# Patient Record
Sex: Female | Born: 1940
Health system: Southern US, Community
[De-identification: ages and names within clinical notes are randomized; demographics above are authoritative.]

## PROBLEM LIST (undated history)

## (undated) DIAGNOSIS — E785 Hyperlipidemia, unspecified: Secondary | ICD-10-CM

## (undated) DIAGNOSIS — M791 Myalgia, unspecified site: Secondary | ICD-10-CM

## (undated) DIAGNOSIS — F419 Anxiety disorder, unspecified: Secondary | ICD-10-CM

## (undated) DIAGNOSIS — R0600 Dyspnea, unspecified: Secondary | ICD-10-CM

## (undated) DIAGNOSIS — E039 Hypothyroidism, unspecified: Secondary | ICD-10-CM

## (undated) DIAGNOSIS — A0472 Enterocolitis due to Clostridium difficile, not specified as recurrent: Secondary | ICD-10-CM

## (undated) DIAGNOSIS — R0609 Other forms of dyspnea: Secondary | ICD-10-CM

## (undated) DIAGNOSIS — Z85038 Personal history of other malignant neoplasm of large intestine: Secondary | ICD-10-CM

## (undated) DIAGNOSIS — C20 Malignant neoplasm of rectum: Secondary | ICD-10-CM

## (undated) DIAGNOSIS — E538 Deficiency of other specified B group vitamins: Secondary | ICD-10-CM

## (undated) HISTORY — DX: Anxiety disorder, unspecified: F41.9

## (undated) HISTORY — DX: Deficiency of other specified B group vitamins: E53.8

## (undated) HISTORY — DX: Hypothyroidism, unspecified: E03.9

## (undated) HISTORY — DX: Other forms of dyspnea: R06.09

## (undated) HISTORY — DX: Myalgia, unspecified site: M79.10

## (undated) HISTORY — DX: Personal history of other malignant neoplasm of large intestine: Z85.038

## (undated) HISTORY — DX: Malignant neoplasm of rectum: C20

## (undated) HISTORY — DX: Hyperlipidemia, unspecified: E78.5

## (undated) HISTORY — DX: Dyspnea, unspecified: R06.00

## (undated) HISTORY — DX: Enterocolitis due to Clostridium difficile, not specified as recurrent: A04.72

---

## 2009-09-14 HISTORY — PX: COLECTOMY: SHX59

## 2009-09-14 HISTORY — PX: COLON RESECTION: SHX5231

## 2010-02-10 ENCOUNTER — Ambulatory Visit: Payer: Self-pay | Admitting: Cardiovascular Disease

## 2010-02-28 ENCOUNTER — Ambulatory Visit: Payer: Self-pay | Admitting: Cardiovascular Disease

## 2010-10-29 ENCOUNTER — Encounter: Payer: Self-pay | Admitting: Cardiovascular Disease

## 2010-11-29 NOTE — Letter (Signed)
February 10, 2010    Dr. Foye Deer  [address]  [place], [place]  [address]   RE:  Danielle Grimes, Danielle Grimes  MRN:  [id number]  /  DOB:  Apr 01, 1941   Dear Dr. Tomasa Blase,   Thank you for referring Ms. Fetting for further cardiac evaluation.  As  you are aware, this is a 70 year old female with the following problem  list:  1. A history of colon cancer, status post resection and colostomy in      March of 2011.  This was followed by radiation therapy and      chemotherapy.  Her chemotherapy medications include oxaliplatin,      fluorouracil, leucovorin, fosaprepitant.  2. Hyperlipidemia.  3. Hypothyroidism.  4. Anxiety.  5. Myalgia.  6. Osteoporosis.   Ms. Kocak is here today for evaluation of an abnormal ECG and a few  episodes of atypical chest pain.  She had a few episodes recently of a  fluttering sensation in her chest which lasted for a few seconds.  She  also complains of exertional dyspnea which has been worse since she was  diagnosed with colon cancer and has been started on treatment.  She has  no previous cardiac history.  There is no previous history of myocardial  infarction.  She has been having a few episodes of dizziness in the  setting of dehydration.  She went to the emergency room about 2 weeks  ago for that reason and was given IV fluids.  There is no reported  syncope.  She does not do any regular exercise.  She is able, though, to  perform her activities of daily living without much limitations.   PAST MEDICAL HISTORY:  As outlined above.   CURRENT MEDICATIONS:  Include:  1. Levothyroxine 150 mcg once daily.  2. Klor-Con 20 mEq once daily.  3. Alendronate 70 mg once weekly.  4. Calcium twice daily.  5. Fish oil twice daily.  6. Vitamin D twice daily.  7. Lomotil 2.5 mg 1-2 tablets as needed.   ALLERGIES:  NO KNOWN DRUG ALLERGIES.   SOCIAL HISTORY:  Negative for smoking, alcohol or drug use.  She is a  retired Lawyer.   FAMILY HISTORY:  Negative for premature  coronary artery disease,  however, both parents died from heart disease.  Her mother died at the  age of 16 from myocardial infarction after cholecystectomy.  Her father  died at the age of 22.   REVIEW OF SYSTEMS:  Remarkable for dyspnea, a few episodes of chest pain  and fluttering sensation, generalized fatigue, anxiety and dizziness.  A  full review of system was obtained and is otherwise negative.   PHYSICAL EXAMINATION:  GENERAL:  She appears to be in no acute distress.  VITAL SIGNS:  Weight is 145.2 pounds, blood pressure is 105/75, pulse is  92 and oxygen saturation is 99% on room air.  NECK:  Examination reveals no masses, no JVD or carotid bruits.  RESPIRATORY:  Normal respiratory effort with no use of accessory  muscles.  Auscultation reveals normal breath sounds with no crackles or  wheezing.  CARDIOVASCULAR:  Normal PMI.  Auscultation reveals normal S1 and S2 with  no gallops or murmurs.  ABDOMEN:  Benign, nontender, nondistended.  There is a surgical scar.  There is a colostomy bag.  EXTREMITIES:  With no clubbing, cyanosis or edema.  The feet feel  slightly cool to touch.  Pedal pulses are +1 bilaterally.  SKIN:  Exam is unremarkable.  PSYCHIATRIC:  She is alert, oriented x3, with normal mood and affect.   Electrocardiogram showed normal sinus rhythm.  There is left axis  deviation.  Possible old inferior infarct.   IMPRESSION:  Atypical chest pain as well as exertional dyspnea with an  abnormal electrocardiogram:  The patient has no previous cardiac  history.  There is no previous history of myocardial infarction.  Her  electrocardiogram is slightly abnormal and suggestive of a possible old  inferior myocardial infarction, although it could be just due to left  axis deviation.  She had a few episodes of fluttering sensation in her  chest but overall there are no convincing symptoms of angina.  Most of  her symptom seems to be exertional dyspnea which has worsened  since she  was diagnosed with cancer and started on chemotherapy.  She is slightly  anemic, which might contribute.  Nonetheless, I think further cardiac  evaluation is warranted.  I will go ahead and obtain a Lexiscan nuclear  stress test as well as an echocardiogram for further evaluation.  The  patient will return for followup after these tests are done.  Further  recommendations will follow after.   Thank you for allowing me to participate in the care of your patient.    Sincerely,      Lorine Bears, MD  Electronically Signed    MA/MedQ  DD: 02/10/2010  DT: 02/10/2010  Job #: [id number]

## 2010-11-29 NOTE — Assessment & Plan Note (Signed)
[facility]                        [facility] OFFICE NOTE   NAME:Grimes, Danielle                           MRN:          [id number]  DATE:02/28/2010                            DOB:          February 25, 1941    PROBLEMS LIST:  1. History of colon cancer, status post resection and colectomy in      March 2011.  This was followed by radiation and chemotherapy.  2. Hyperlipidemia.  3. Hypothyroidism.  4. Anxiety.  5. Myalgia.  6. Osteoporosis.   The patient is here today for a followup visit after she underwent  cardiac evaluation.  I saw her last month for evaluation of an abnormal  ECG with possible inferior Q-waves as well as atypical chest pain.  Since her last visit, she actually feels better.  She has not had any  episodes of chest pain.  She does have chronic exertional dyspnea, but  it seems to be better on the last visit.  She has not had any more  palpitations.   MEDICATIONS:  1. Levothyroxine 150 mg once daily.  2. Klor-Con 20 mEq once daily.  3. Alendronate 70 mg weekly.  4. Calcium 600 mg twice daily.  5. Fish oil twice daily.  6. Vitamin D1 twice daily.  7. Lomotil as needed.   ALLERGIES:  No known drug allergies.   PHYSICAL EXAMINATION:  GENERAL:  Overall, she appears to be in no acute  distress.  VITAL SIGNS:  Weight is 146.2 pounds, blood pressure is 127/78, pulse is  83, oxygen saturation is 97% on room air.  NECK:  No JVD or carotid bruits.  LUNGS:  Clear to auscultation.  CARDIOVASCULAR:  Regular rate and rhythm with no gallops or murmurs.  ABDOMEN:  Benign, nontender, nondistended.  EXTREMITIES:  With no clubbing, cyanosis, or edema.   IMPRESSION:  1. Atypical chest pain with an abnormal baseline ECG.  She did have a      Lexiscan nuclear stress test which showed no evidence of ischemia      or infarct with normal ejection fraction.  She is not having any      more symptoms of chest pain any more.  Her abnormal ECG is  likely      due to left axis deviation and these are not true Q-waves.  2. Exertional dyspnea:  We did do an echocardiogram which showed      normal LV systolic function with only mild aortic, tricuspid, and      mitral regurgitation.  Obviously, at this level of severity, these      should not be causing significant limitations.  No further cardiac      testing for this is needed.  I suspect she does have a component of      physical deconditioning especially after all the surgeries and      chemotherapy that she went through.  She was also slightly anemic      after her chemotherapy which might contribute.  This will be      checked again after her visit with her oncologist.  Once  she is      through with revision of her colostomy and recovers from the      surgery, I recommended to start a regular exercise program to      improve her exercise tolerance.  The patient will follow up with [place]      as needed.     Lorine Bears, MD  Electronically Signed    MA/MedQ  DD: 02/28/2010  DT: 03/01/2010  Job #: [id number]

## 2011-01-31 ENCOUNTER — Encounter: Payer: Self-pay | Admitting: Cardiovascular Disease

## 2011-04-05 ENCOUNTER — Encounter: Payer: Self-pay | Admitting: Infectious Disease

## 2011-04-05 ENCOUNTER — Ambulatory Visit (INDEPENDENT_AMBULATORY_CARE_PROVIDER_SITE_OTHER): Payer: Medicare PPO | Admitting: Infectious Disease

## 2011-04-05 DIAGNOSIS — E538 Deficiency of other specified B group vitamins: Secondary | ICD-10-CM | POA: Insufficient documentation

## 2011-04-05 DIAGNOSIS — A0472 Enterocolitis due to Clostridium difficile, not specified as recurrent: Secondary | ICD-10-CM

## 2011-04-05 DIAGNOSIS — N289 Disorder of kidney and ureter, unspecified: Secondary | ICD-10-CM

## 2011-04-05 DIAGNOSIS — C20 Malignant neoplasm of rectum: Secondary | ICD-10-CM

## 2011-04-05 LAB — CBC WITH DIFFERENTIAL/PLATELET
Eosinophils Relative: 1 % (ref 0–5)
HCT: 40.9 % (ref 36.0–46.0)
Hemoglobin: 13.3 g/dL (ref 12.0–15.0)
Lymphocytes Relative: 24 % (ref 12–46)
MCHC: 32.5 g/dL (ref 30.0–36.0)
MCV: 92.3 fL (ref 78.0–100.0)
Monocytes Absolute: 0.5 10*3/uL (ref 0.1–1.0)
Monocytes Relative: 8 % (ref 3–12)
Neutro Abs: 4.4 10*3/uL (ref 1.7–7.7)
WBC: 6.6 10*3/uL (ref 4.0–10.5)

## 2011-04-05 MED ORDER — VANCOMYCIN HCL 125 MG PO CAPS: 125.0000 mg | ORAL_CAPSULE | ORAL | Status: AC

## 2011-04-05 MED ORDER — VANCOMYCIN HCL 125 MG PO CAPS: 125.0000 mg | ORAL_CAPSULE | ORAL | Status: DC

## 2011-04-05 MED ORDER — SACCHAROMYCES BOULARDII 250 MG PO CAPS: 250.0000 mg | ORAL_CAPSULE | Freq: Two times a day (BID) | ORAL | Status: AC

## 2011-04-05 NOTE — Progress Notes (Signed)
Subjective:    Patient ID: Danielle Grimes, female    DOB: Jun 27, 1941, 70 y.o.   MRN: [id number]  HPI  This is a 59 -year-old Caucasian female with stage IIIB rectal cancer diagnosed in December of 2010. She had a moderately demented differentiated adenocarcinoma T3, N1, M0 that was treated with neoadjuvant radiation and chemotherapy with infusional 5 fluorouracil. She had surgical resection at [facility] March 11. She also had attempted postoperative chemotherapy with FOLFOX but did not tolerate the full round of therapy duty at diarrhea hypotension and dehydration. She recalls that she was treated with ciprofloxacin in March of 2012 who was thought to be urinary tract infection and then developed a discharge in May of 2012 that was treated with ciprofloxacin and Flagyl. This unfortunately worsened and she was ultimately diagnosed with C. difficile colitis in June of 2012 at which point time she received 2 weeks of Flagyl. She is sensitive several recurrences of C. difficile colitis including in July with metronidazole and vancomycin and then with recurrence in August on 2 different occasions again treated with vancomycin. He is like off the vancomycin and then developed perfuse diarrhea. She was incorrectly informed by the emergency department locally where she lives to take Imodium rather than restart her vancomycin. She presents to clinic today for evaluation of her recurrent Clostridium difficile colitis. Spent well over an hour with this patient including greater than 50% of time counseling the patient and coordinating her care. We will integrate leg lengths to explain the nature C. difficile colitis. I explained to her that patients with differential colitis tend to not perform a fracture active immunoglobulins to C. difficile colitis and this particular immune deficit is not currently treated with the therapies we have. I also informed her that antibiotic such as ciprofloxacin  levofloxacin and moxifloxacin as well along with cephalosporins put one at high risk for recurrent C. difficile colitis. I also informed her that proton pump inhibitors and other drugs to suppress the acid content of the stomach also put the patient at risk for recurrent C. difficile colitis. In reviewing her records it does not seem that she ever received in the more standard therapy for recurrent C. difficile colitis by this with a so-called "pulse and taper therapy with vancomycin over 6 weeks' time. Her lites proceed with such a therapy to have her visit the pharmacy next door where she can receive a liquid formulation of the vancomycin which is far cheaper than the pills that have been formulated by the pharmaceutical company. Review of Systems  Constitutional: Negative for fever, chills, diaphoresis, activity change, appetite change, fatigue and unexpected weight change.  HENT: Negative for congestion, sore throat, rhinorrhea, sneezing, trouble swallowing and sinus pressure.   Eyes: Negative for photophobia and visual disturbance.  Respiratory: Negative for cough, chest tightness, shortness of breath, wheezing and stridor.   Cardiovascular: Negative for chest pain, palpitations and leg swelling.  Gastrointestinal: Positive for diarrhea and rectal pain. Negative for nausea, vomiting, abdominal pain, constipation, blood in stool, abdominal distention and anal bleeding.  Genitourinary: Negative for dysuria, hematuria, flank pain and difficulty urinating.  Musculoskeletal: Negative for myalgias, back pain, joint swelling, arthralgias and gait problem.  Skin: Negative for color change, pallor, rash and wound.  Neurological: Negative for dizziness, tremors, weakness and light-headedness.  Hematological: Negative for adenopathy. Does not bruise/bleed easily.  Psychiatric/Behavioral: Negative for behavioral problems, confusion, sleep disturbance, dysphoric mood, decreased concentration and agitation.        Objective:  Physical Exam  Constitutional: She is oriented to person, place, and time. She appears well-developed and well-nourished. No distress.  HENT:  Head: Normocephalic and atraumatic.  Mouth/Throat: Oropharynx is clear and moist. No oropharyngeal exudate.  Eyes: Conjunctivae and EOM are normal. Pupils are equal, round, and reactive to light. No scleral icterus.  Neck: Normal range of motion. Neck supple. No JVD present.  Cardiovascular: Normal rate, regular rhythm and normal heart sounds.  Exam reveals no gallop and no friction rub.   No murmur heard. Pulmonary/Chest: Effort normal and breath sounds normal. No respiratory distress. She has no wheezes. She has no rales. She exhibits no tenderness.  Abdominal: She exhibits distension. She exhibits no mass. There is tenderness. There is no rebound and no guarding.       Surgical sites clean dry and intact  Musculoskeletal: She exhibits no edema and no tenderness.  Lymphadenopathy:    She has no cervical adenopathy.  Neurological: She is alert and oriented to person, place, and time. She has normal reflexes. She exhibits normal muscle tone. Coordination normal.  Skin: Skin is warm and dry. She is not diaphoretic. No erythema. No pallor.  Psychiatric: She has a normal mood and affect. Her behavior is normal. Judgment and thought content normal.          Assessment & Plan:  Clostridium difficile colitis Recurrent. 2 the patient with "pulse and taper vancomycin therapy start Flora Star twice daily and continue indefinitely. Avoid administer antibiotics and particularly please avoid all fluoroquinolones and be very judicious with any use of cephalosporins as well. Avoid proton pump inhibitors and acid suppressive drugs. If she has further chemotherapy she will also have risk from this for recurrent C. difficile colitis as well. Other options would include giving her for dificid and had finally stool transplantation as a  possibility  Renal insufficiency Given the number of stools this is something we need to watch out for a check her creatinine today.

## 2011-04-05 NOTE — Assessment & Plan Note (Signed)
Given the number of stools this is something we need to watch out for a check her creatinine today.

## 2011-04-05 NOTE — Assessment & Plan Note (Signed)
Recurrent. 2 the patient with "pulse and taper vancomycin therapy start Flora Star twice daily and continue indefinitely. Avoid administer antibiotics and particularly please avoid all fluoroquinolones and be very judicious with any use of cephalosporins as well. Avoid proton pump inhibitors and acid suppressive drugs. If she has further chemotherapy she will also have risk from this for recurrent C. difficile colitis as well. Other options would include giving her for dificid and had finally stool transplantation as a possibility

## 2011-04-05 NOTE — Patient Instructions (Signed)
tHE FOLLOWING IS YOUR VANCOMYCIN REGIMEN:  125 mg orally four times daily for  14 days  125 mg orally twice daily for 7 days  125 mg orally once daily for 7 days  125 mg orally every other day for 7 days  125 mg orally every 3 days for 14 days   START FLORASTOR 250MG  TWICE DAILY  AVOID THE FOLLOWING ANTIBIOTICS CIPROFLOXACIN, LEVOFLOXACIN, MOXIFLOXACIN  CEPHALEXIN, CEFTRIAXONE ARE OTHER HIGH RISK ONES BUT ANY ANTIBIOTICS CAN CAUSE C DIFFICILE COLITIS AVOID PROTONIX, NEXIUM, PROTON PUMP INHIBITORS, AVOID ANTACIDS

## 2011-04-06 LAB — COMPLETE METABOLIC PANEL WITH GFR
Albumin: 4.2 g/dL (ref 3.5–5.2)
BUN: 9 mg/dL (ref 6–23)
Calcium: 9.4 mg/dL (ref 8.4–10.5)
Chloride: 102 mEq/L (ref 96–112)
GFR, Est African American: >60 mL/min (ref >60)
GFR, Est Non African American: >60 mL/min (ref >60)
Glucose, Bld: 87 mg/dL (ref 70–99)
Potassium: 4.3 mEq/L (ref 3.5–5.3)

## 2011-05-08 ENCOUNTER — Telehealth: Payer: Self-pay | Admitting: *Deleted

## 2011-05-08 NOTE — Telephone Encounter (Signed)
Per Dr. Daiva Eves, patient given appointment for tomorrow 05/09/11 at 4:15 pm.

## 2011-05-08 NOTE — Telephone Encounter (Signed)
Patient called c/o pain at ileostomy site and rectum.  Having frequent bowel movements, several a day, but not diarrhea.  She tried to make an appointment with her surgeon, but when she told him she had C-diff, she was told to wait until that resolved before she makes an appointment.  She is requesting an appointment with Dr. Daiva Eves or advice. Wendall Mola CMA

## 2011-05-09 ENCOUNTER — Encounter: Payer: Self-pay | Admitting: Infectious Disease

## 2011-05-09 ENCOUNTER — Ambulatory Visit (INDEPENDENT_AMBULATORY_CARE_PROVIDER_SITE_OTHER): Payer: Medicare PPO | Admitting: Infectious Disease

## 2011-05-09 VITALS — BP 155/83 | HR 77 | Temp 97.5°F | Wt 145.0 lb

## 2011-05-09 DIAGNOSIS — C20 Malignant neoplasm of rectum: Secondary | ICD-10-CM

## 2011-05-09 DIAGNOSIS — A0472 Enterocolitis due to Clostridium difficile, not specified as recurrent: Secondary | ICD-10-CM

## 2011-05-09 DIAGNOSIS — R197 Diarrhea, unspecified: Secondary | ICD-10-CM

## 2011-05-09 DIAGNOSIS — K6289 Other specified diseases of anus and rectum: Secondary | ICD-10-CM

## 2011-05-09 DIAGNOSIS — R109 Unspecified abdominal pain: Secondary | ICD-10-CM

## 2011-05-09 LAB — CBC WITH DIFFERENTIAL/PLATELET
Basophils Relative: 1 % (ref 0–1)
Eosinophils Absolute: 0.2 10*3/uL (ref 0.0–0.7)
Eosinophils Relative: 3 % (ref 0–5)
HCT: 41.4 % (ref 36.0–46.0)
Hemoglobin: 13.4 g/dL (ref 12.0–15.0)
MCH: 29.3 pg (ref 26.0–34.0)
MCHC: 32.4 g/dL (ref 30.0–36.0)
Monocytes Absolute: 0.4 10*3/uL (ref 0.1–1.0)
Monocytes Relative: 9 % (ref 3–12)
RDW: 12.7 % (ref 11.5–15.5)

## 2011-05-09 NOTE — Assessment & Plan Note (Signed)
See above discussion, not clear that this is actually c diff that we are dealign with now

## 2011-05-09 NOTE — Assessment & Plan Note (Signed)
Sp resection, chemotherapy and xRT

## 2011-05-09 NOTE — Assessment & Plan Note (Signed)
I am really beginning to wonder if her persistent multiple loose bowel movements  ARE NOT DUE TO C DIFFICILE COLITIS. If she in fact had c diff she should have improved on vancomycin and then worsened off of it which does not appear to be the case. Her WBC being normal when we saw her last would also argue against c difficile and I wish we had tested her at that time with a c diff pcr and toxin. I will check a CT abd/pelvis to investigate further. I will check c diff toxin assay and c diff pcr though I would be shocked if either test is positive on vancomycin. If the CT scan is not showing evidence of colitis I would then stop her vancomycin taper and infer that her condition is not due to recurrent cdifficile colitis. Ultimately she is likely going to need the help of her GI MD Dr Chales Abrahams with likely need for repeat colonoscopy. I am ok with her taking imodium carefully at present

## 2011-05-09 NOTE — Progress Notes (Signed)
Subjective:    Patient ID: Danielle Grimes, female    DOB: [birth year], 70 y.o.   MRN: [id number]  HPI  This is a70 year old Caucasian female with stage IIIB rectal cancer diagnosed in December of 2010. She had a moderately demented differentiated adenocarcinoma T3, N1, M0 that was treated with neoadjuvant radiation and chemotherapy with infusional 5 fluorouracil. She had surgical resection at [facility] March 11. She also had attempted postoperative chemotherapy with FOLFOX but did not tolerate the full round of therapy due to  diarrhea hypotension and dehydration. She recalls that she was treated with ciprofloxacin in March of 2012 who was thought to be urinary tract infection and then developed a discharge in May of 2012 that was treated with ciprofloxacin and Flagyl. This unfortunately worsened and she was ultimately diagnosed with C. difficile colitis in June of 2012 at which point time she received 2 weeks of Flagyl. She is sensitive several recurrences of C. difficile colitis including in July with metronidazole and vancomycin and then with recurrence in August on 2 different occasions again treated with vancomycin. He was  off the vancomycin and then developed perfuse diarrhea. She was incorrectly informed by the emergency department locally where she lives to take Imodium rather than restart her vancomycin. She presents to clinic in September with me  for evaluation of her recurrent Clostridium difficile colitis.   We checked her cbc, cmp and started her on vancomycin pulse and taper course.  Her white blood cell count was not elevated. Since being treated with vancomycin she has not had any improvement in her multiple bowel movements. She continues to have bowel movements at times hourly. They are not watery but are partially formed. According to the patient and the daughter she has actually had these multiple bowel movements ever since she had a re\re anastomosis. In talking to him  closely it seems as if there is not a clear correlation between her multiple bowel movements and an assay being positive for C. Difficile.  I am certainly beginning to wonder if her multiple bowel movements are not due to C. difficile colitis but rather due to a post operative condition. She currently is complaining of abdominal pain and continued loose bowel movements despite her pulse and taper with vancomycin. On talking to her she states that treatment with vancomycin did not reduce the frequency of her bowel movements whatsoever. In fact unfortunately her multiple bowel movements had worsened when taken off the Imodium and Lomotil.  She presents to clinic today urgently do concerned about cramping abdominal pain as well as continued multiple bowel movements at times hourly. She is on the every other day today component of her vancomycin pulse and taper.  We discussed getting blood work today including CBC and CMP. I'm also a chance send her stool for C. difficile toxin and C diff PCR although the sensitivity of either test will be low with the patient on vancomycin.    Spent well over 45 minutes  with this patient including greater than 50% of time counseling the patient and coordinating her care.   Review of Systems  Constitutional: Negative for fever, chills, diaphoresis, activity change, appetite change, fatigue and unexpected weight change.  HENT: Negative for congestion, sore throat, rhinorrhea, sneezing, trouble swallowing and sinus pressure.   Eyes: Negative for photophobia and visual disturbance.  Respiratory: Negative for cough, chest tightness, shortness of breath, wheezing and stridor.   Cardiovascular: Negative for chest pain, palpitations and leg swelling.  Gastrointestinal: Positive  for abdominal pain, diarrhea, abdominal distention and rectal pain. Negative for nausea, vomiting, constipation, blood in stool and anal bleeding.  Genitourinary: Negative for dysuria, hematuria,  flank pain and difficulty urinating.  Musculoskeletal: Negative for myalgias, back pain, joint swelling, arthralgias and gait problem.  Skin: Negative for color change, pallor, rash and wound.  Neurological: Negative for dizziness, tremors, weakness and light-headedness.  Hematological: Negative for adenopathy. Does not bruise/bleed easily.  Psychiatric/Behavioral: Negative for behavioral problems, confusion, sleep disturbance, dysphoric mood, decreased concentration and agitation.       Objective:   Physical Exam  Constitutional: She is oriented to person, place, and time. She appears well-developed and well-nourished. No distress.  HENT:  Head: Normocephalic and atraumatic.  Mouth/Throat: Oropharynx is clear and moist. No oropharyngeal exudate.  Eyes: Conjunctivae and EOM are normal. Pupils are equal, round, and reactive to light. No scleral icterus.  Neck: Normal range of motion. Neck supple. No JVD present.  Cardiovascular: Normal rate, regular rhythm and normal heart sounds.  Exam reveals no gallop and no friction rub.   No murmur heard. Pulmonary/Chest: Effort normal and breath sounds normal. No respiratory distress. She has no wheezes. She has no rales. She exhibits no tenderness.  Abdominal: She exhibits no distension and no mass. There is tenderness. There is no rebound and no guarding.    Musculoskeletal: She exhibits no edema and no tenderness.  Lymphadenopathy:    She has no cervical adenopathy.  Neurological: She is alert and oriented to person, place, and time. She has normal reflexes. She exhibits normal muscle tone. Coordination normal.  Skin: Skin is warm and dry. She is not diaphoretic. No erythema. No pallor.  Psychiatric: She has a normal mood and affect. Her behavior is normal. Judgment and thought content normal.          Assessment & Plan:  Diarrhea I am really beginning to wonder if her persistent multiple loose bowel movements  ARE NOT DUE TO C DIFFICILE  COLITIS. If she in fact had c diff she should have improved on vancomycin and then worsened off of it which does not appear to be the case. Her WBC being normal when we saw her last would also argue against c difficile and I wish we had tested her at that time with a c diff pcr and toxin. I will check a CT abd/pelvis to investigate further. I will check c diff toxin assay and c diff pcr though I would be shocked if either test is positive on vancomycin. If the CT scan is not showing evidence of colitis I would then stop her vancomycin taper and infer that her condition is not due to recurrent cdifficile colitis. Ultimately she is likely going to need the help of her GI MD Dr Chales Abrahams with likely need for repeat colonoscopy. I am ok with her taking imodium carefully at present  Clostridium difficile colitis See above discussion, not clear that this is actually c diff that we are dealign with now  Abdominal pain Likely due to the process in her colon. Ct abdomen may help [place] here  Rectal cancer Sp resection, chemotherapy and xRT  Rectal pain Sitz baths might be helfpul. Controlling her diarrhea is critical

## 2011-05-09 NOTE — Assessment & Plan Note (Signed)
Sitz baths might be helfpul. Controlling her diarrhea is critical

## 2011-05-09 NOTE — Assessment & Plan Note (Signed)
Likely due to the process in her colon. Ct abdomen may help [place] here

## 2011-05-10 LAB — COMPLETE METABOLIC PANEL WITH GFR
ALT: 18 U/L (ref 0–35)
Albumin: 4.3 g/dL (ref 3.5–5.2)
Alkaline Phosphatase: 51 U/L (ref 39–117)
CO2: 26 mEq/L (ref 19–32)
GFR, Est African American: >90 mL/min (ref >90)
Potassium: 4.2 mEq/L (ref 3.5–5.3)
Sodium: 140 mEq/L (ref 135–145)
Total Bilirubin: 0.4 mg/dL (ref 0.3–1.2)
Total Protein: 7.2 g/dL (ref 6.0–8.3)

## 2011-05-16 ENCOUNTER — Other Ambulatory Visit (HOSPITAL_COMMUNITY): Payer: Medicare PPO

## 2011-05-16 ENCOUNTER — Ambulatory Visit (HOSPITAL_COMMUNITY)
Admission: RE | Admit: 2011-05-16 | Discharge: 2011-05-16 | Disposition: A | Discharge status: Home / Self Care | Payer: Medicare PPO | Source: Ambulatory Visit | Attending: Infectious Disease | Admitting: Infectious Disease

## 2011-05-16 DIAGNOSIS — Z09 Encounter for follow-up examination after completed treatment for conditions other than malignant neoplasm: Secondary | ICD-10-CM | POA: Insufficient documentation

## 2011-05-16 DIAGNOSIS — R32 Unspecified urinary incontinence: Secondary | ICD-10-CM | POA: Insufficient documentation

## 2011-05-16 DIAGNOSIS — A0472 Enterocolitis due to Clostridium difficile, not specified as recurrent: Secondary | ICD-10-CM

## 2011-05-16 DIAGNOSIS — R933 Abnormal findings on diagnostic imaging of other parts of digestive tract: Secondary | ICD-10-CM | POA: Insufficient documentation

## 2011-05-16 MED ORDER — IOHEXOL 300 MG/ML  SOLN
80.0000 mL | Freq: Once | INTRAMUSCULAR | Status: AC | PRN
  Administered 2011-05-16: 80 mL via INTRAVENOUS

## 2011-05-22 ENCOUNTER — Encounter: Payer: Self-pay | Admitting: Infectious Disease

## 2011-05-22 ENCOUNTER — Ambulatory Visit (INDEPENDENT_AMBULATORY_CARE_PROVIDER_SITE_OTHER): Payer: Medicare PPO | Admitting: Infectious Disease

## 2011-05-22 DIAGNOSIS — K629 Disease of anus and rectum, unspecified: Secondary | ICD-10-CM | POA: Insufficient documentation

## 2011-05-22 DIAGNOSIS — K9189 Other postprocedural complications and disorders of digestive system: Secondary | ICD-10-CM

## 2011-05-22 DIAGNOSIS — A0472 Enterocolitis due to Clostridium difficile, not specified as recurrent: Secondary | ICD-10-CM

## 2011-05-22 DIAGNOSIS — K651 Peritoneal abscess: Secondary | ICD-10-CM

## 2011-05-22 DIAGNOSIS — IMO0002 Reserved for concepts with insufficient information to code with codable children: Secondary | ICD-10-CM | POA: Insufficient documentation

## 2011-05-22 DIAGNOSIS — R197 Diarrhea, unspecified: Secondary | ICD-10-CM

## 2011-05-22 DIAGNOSIS — K6289 Other specified diseases of anus and rectum: Secondary | ICD-10-CM

## 2011-05-22 DIAGNOSIS — K929 Disease of digestive system, unspecified: Secondary | ICD-10-CM

## 2011-05-22 NOTE — Assessment & Plan Note (Signed)
I suspect most of her postoperative diarrhea that has persisted despite various courses for C diff has actually been postoperative diarrhea rather than colitis. C difficile PCR while extremely sensitive can reflect colonization since it assays for DNA from the organism and not production of the toxin

## 2011-05-22 NOTE — Assessment & Plan Note (Signed)
Reviewed films with Dr. Gery Pray and Fredia Sorrow from Radiology. Supsicion is high for an anastomotic leak. This is not clearly an abscess by imaging. I am going to have the pt see her surgeon at [facility] ;and we will burn CD rom images of findings for surgeon at [facility]. There is possiblity that the pt could have abscess but I am in no way eager to blindly start antibiotics in this pt who has had several bouts of c diff colitis (though this diagnosis may have been harder to prove in some cases given fact of her persisetnt diarrhea postop

## 2011-05-22 NOTE — Assessment & Plan Note (Signed)
Again requires eval at [facility] for narrowing in the rectum

## 2011-05-22 NOTE — Assessment & Plan Note (Signed)
This area of gas and some fluid could be an abscess. IR can place drain here at [facility] but preference would be for this to be done at [facility] or at least in concert with [facility] who perfomed the surgery

## 2011-05-22 NOTE — Assessment & Plan Note (Signed)
She may have had several episodes but this does not explain her symptoms and her course and her CT now shows zero evidence of colitis

## 2011-05-22 NOTE — Progress Notes (Signed)
Subjective:    Patient ID: Danielle Grimes, female    DOB: 13-Jun-1941, 70 y.o.   MRN: [id number]  HPI  70 year old Caucasian female with stage IIIB rectal cancer diagnosed in December of 2010. She had a moderately demented differentiated adenocarcinoma T3, N1, M0 that was treated with neoadjuvant radiation and chemotherapy with infusional 5 fluorouracil. She had surgical resection at [facility] March 11. She also had attempted postoperative chemotherapy with FOLFOX but did not tolerate the full round of therapy due to diarrhea hypotension and dehydration. She recalls that she was treated with ciprofloxacin in March of 2012 who was thought to be urinary tract infection and then developed a discharge in May of 2012 that was treated with ciprofloxacin and Flagyl. This unfortunately worsened and she was ultimately diagnosed with C. difficile colitis in June of 2012 at which point time she received 2 weeks of Flagyl. She is sensitive several recurrences of C. difficile colitis including in July with metronidazole and vancomycin and then with recurrence in August on 2 different occasions again treated with vancomycin. He was off the vancomycin and then developed perfuse diarrhea. She was incorrectly informed by the emergency department locally where she lives to take Imodium rather than restart her vancomycin. She presents to clinic in September with me for evaluation of her recurrent Clostridium difficile colitis.  We checked her cbc, cmp and started her on vancomycin pulse and taper course.  Her white blood cell count was not elevated. Since being treated with vancomycin she has not had any improvement in her multiple bowel movements. She continues to have bowel movements at times hourly. They are not watery but are partially formed. According to the patient and the daughter she has actually had these multiple bowel movements ever since she had a re\re anastomosis. In talking to him closely it  seems as if there is not a clear correlation between her multiple bowel movements and an assay being positive for C. Difficile.  I am certainly beginning to wonder if her multiple bowel movements are not due to C. difficile colitis but rather due to a post operative condition. She continued to complain of abdominal pain and continued loose bowel movements despite her pulse and taper with vancomycin. On talking to her she states that treatment with vancomycin did not reduce the frequency of her bowel movements whatsoever. In fact unfortunately her multiple bowel movements had worsened when taken off the Imodium and Lomotil. I saw her in clnic on the 23rd of October and checked her wbc which was completely normal. We checked c difficile pcr though we knew it would be negative since pt was already on vancomycin. I ordered a CT of the abdomen and pelvis and this showed ABDSOLUTELY NO EVIDENCE FOR COLITIS AND NO EVIDENCE FOR RECURRENT MALIGANCY. IT DID SHOW APPARENT NARROWING IN THE RECTUM AND IT SHOWED A 3X4CM AREA OF LARGELY GAS, BUT ALSO FLUID ADJACENT TO THE ANASTAMOSIS SITE. The patient returnst to clinic today. She has finished her vancomycin pulse and taper. She never began imodium.  Dr. [employer]  Review of Systems  Constitutional: Negative for fever, chills, diaphoresis, activity change, appetite change, fatigue and unexpected weight change.  HENT: Negative for congestion, sore throat, rhinorrhea, sneezing, trouble swallowing and sinus pressure.   Eyes: Negative for photophobia and visual disturbance.  Respiratory: Negative for cough, chest tightness, shortness of breath, wheezing and stridor.   Cardiovascular: Negative for chest pain, palpitations and leg swelling.  Gastrointestinal: Positive for nausea, abdominal  pain and diarrhea. Negative for vomiting, constipation, blood in stool, abdominal distention and anal bleeding.  Genitourinary: Negative for dysuria, hematuria, flank pain and  difficulty urinating.  Musculoskeletal: Negative for myalgias, back pain, joint swelling, arthralgias and gait problem.  Skin: Negative for color change, pallor, rash and wound.  Neurological: Negative for dizziness, tremors, weakness and light-headedness.  Hematological: Negative for adenopathy. Does not bruise/bleed easily.  Psychiatric/Behavioral: Negative for behavioral problems, confusion, sleep disturbance, dysphoric mood, decreased concentration and agitation.       Objective:   Physical Exam  Constitutional: She is oriented to person, place, and time. She appears well-developed and well-nourished. No distress.  HENT:  Head: Normocephalic and atraumatic.  Mouth/Throat: Oropharynx is clear and moist. No oropharyngeal exudate.  Eyes: Conjunctivae and EOM are normal. Pupils are equal, round, and reactive to light. No scleral icterus.  Neck: Normal range of motion. Neck supple. No JVD present.  Cardiovascular: Normal rate, regular rhythm and normal heart sounds.  Exam reveals no gallop and no friction rub.   No murmur heard. Pulmonary/Chest: Effort normal and breath sounds normal. No respiratory distress. She has no wheezes. She has no rales. She exhibits no tenderness.  Abdominal: She exhibits no distension and no mass. There is tenderness. There is no rebound and no guarding.  Musculoskeletal: She exhibits no edema and no tenderness.  Lymphadenopathy:    She has no cervical adenopathy.  Neurological: She is alert and oriented to person, place, and time. She has normal reflexes. She exhibits normal muscle tone. Coordination normal.  Skin: Skin is warm and dry. She is not diaphoretic. No erythema. No pallor.  Psychiatric: She has a normal mood and affect. Her behavior is normal. Judgment and thought content normal.          Assessment & Plan:

## 2011-05-26 ENCOUNTER — Telehealth: Payer: Self-pay | Admitting: Infectious Disease

## 2011-05-26 NOTE — Telephone Encounter (Signed)
  Tamika,  Can you have my notes  AND THE RADIOLOGY REPORT faxed to the following number fro Clelia Schaumann, MD  Fax number  [phone]   I am working on having the images burned on CDrom and sent to :   Rosalyn Charters, MD  [address] [place] [address] 7th floor [address] [place], [place] [address]

## 2011-06-05 ENCOUNTER — Ambulatory Visit: Payer: Medicare PPO | Admitting: Infectious Disease

## 2011-06-19 ENCOUNTER — Other Ambulatory Visit: Payer: Self-pay | Admitting: Licensed Clinical Social Worker

## 2012-08-20 ENCOUNTER — Encounter: Payer: Self-pay | Admitting: Infectious Diseases

## 2014-10-19 HISTORY — PX: COLONOSCOPY: SHX174

## 2014-12-25 DIAGNOSIS — Z1231 Encounter for screening mammogram for malignant neoplasm of breast: Secondary | ICD-10-CM | POA: Diagnosis not present

## 2015-01-01 DIAGNOSIS — R252 Cramp and spasm: Secondary | ICD-10-CM | POA: Diagnosis not present

## 2015-01-01 DIAGNOSIS — E785 Hyperlipidemia, unspecified: Secondary | ICD-10-CM | POA: Diagnosis not present

## 2015-01-01 DIAGNOSIS — Z85038 Personal history of other malignant neoplasm of large intestine: Secondary | ICD-10-CM | POA: Diagnosis not present

## 2015-01-01 DIAGNOSIS — M810 Age-related osteoporosis without current pathological fracture: Secondary | ICD-10-CM | POA: Diagnosis not present

## 2015-01-01 DIAGNOSIS — R197 Diarrhea, unspecified: Secondary | ICD-10-CM | POA: Diagnosis not present

## 2015-01-01 DIAGNOSIS — Z6820 Body mass index (BMI) 20.0-20.9, adult: Secondary | ICD-10-CM | POA: Diagnosis not present

## 2015-01-01 DIAGNOSIS — E039 Hypothyroidism, unspecified: Secondary | ICD-10-CM | POA: Diagnosis not present

## 2015-01-05 DIAGNOSIS — C20 Malignant neoplasm of rectum: Secondary | ICD-10-CM | POA: Diagnosis not present

## 2015-01-07 DIAGNOSIS — Z6821 Body mass index (BMI) 21.0-21.9, adult: Secondary | ICD-10-CM | POA: Diagnosis not present

## 2015-01-07 DIAGNOSIS — Z9181 History of falling: Secondary | ICD-10-CM | POA: Diagnosis not present

## 2015-01-07 DIAGNOSIS — E876 Hypokalemia: Secondary | ICD-10-CM | POA: Diagnosis not present

## 2015-01-07 DIAGNOSIS — E785 Hyperlipidemia, unspecified: Secondary | ICD-10-CM | POA: Diagnosis not present

## 2015-01-07 DIAGNOSIS — K529 Noninfective gastroenteritis and colitis, unspecified: Secondary | ICD-10-CM | POA: Diagnosis not present

## 2015-01-07 DIAGNOSIS — Z1389 Encounter for screening for other disorder: Secondary | ICD-10-CM | POA: Diagnosis not present

## 2015-01-07 DIAGNOSIS — E039 Hypothyroidism, unspecified: Secondary | ICD-10-CM | POA: Diagnosis not present

## 2015-01-07 DIAGNOSIS — C20 Malignant neoplasm of rectum: Secondary | ICD-10-CM | POA: Diagnosis not present

## 2015-01-12 DIAGNOSIS — C44319 Basal cell carcinoma of skin of other parts of face: Secondary | ICD-10-CM | POA: Diagnosis not present

## 2015-02-03 DIAGNOSIS — E039 Hypothyroidism, unspecified: Secondary | ICD-10-CM | POA: Diagnosis not present

## 2015-03-05 DIAGNOSIS — E039 Hypothyroidism, unspecified: Secondary | ICD-10-CM | POA: Diagnosis not present

## 2015-04-02 DIAGNOSIS — J208 Acute bronchitis due to other specified organisms: Secondary | ICD-10-CM | POA: Diagnosis not present

## 2015-04-02 DIAGNOSIS — E039 Hypothyroidism, unspecified: Secondary | ICD-10-CM | POA: Diagnosis not present

## 2015-04-02 DIAGNOSIS — Z6821 Body mass index (BMI) 21.0-21.9, adult: Secondary | ICD-10-CM | POA: Diagnosis not present

## 2015-07-02 DIAGNOSIS — Z85048 Personal history of other malignant neoplasm of rectum, rectosigmoid junction, and anus: Secondary | ICD-10-CM | POA: Diagnosis not present

## 2015-07-09 DIAGNOSIS — K529 Noninfective gastroenteritis and colitis, unspecified: Secondary | ICD-10-CM | POA: Diagnosis not present

## 2015-07-09 DIAGNOSIS — Z6821 Body mass index (BMI) 21.0-21.9, adult: Secondary | ICD-10-CM | POA: Diagnosis not present

## 2015-07-09 DIAGNOSIS — Z23 Encounter for immunization: Secondary | ICD-10-CM | POA: Diagnosis not present

## 2015-07-09 DIAGNOSIS — E785 Hyperlipidemia, unspecified: Secondary | ICD-10-CM | POA: Diagnosis not present

## 2015-07-09 DIAGNOSIS — C20 Malignant neoplasm of rectum: Secondary | ICD-10-CM | POA: Diagnosis not present

## 2015-07-09 DIAGNOSIS — Z139 Encounter for screening, unspecified: Secondary | ICD-10-CM | POA: Diagnosis not present

## 2015-07-09 DIAGNOSIS — E039 Hypothyroidism, unspecified: Secondary | ICD-10-CM | POA: Diagnosis not present

## 2015-07-09 DIAGNOSIS — E876 Hypokalemia: Secondary | ICD-10-CM | POA: Diagnosis not present

## 2015-08-10 DIAGNOSIS — E039 Hypothyroidism, unspecified: Secondary | ICD-10-CM | POA: Diagnosis not present

## 2015-12-30 DIAGNOSIS — R233 Spontaneous ecchymoses: Secondary | ICD-10-CM | POA: Diagnosis not present

## 2015-12-30 DIAGNOSIS — L821 Other seborrheic keratosis: Secondary | ICD-10-CM | POA: Diagnosis not present

## 2015-12-31 DIAGNOSIS — C20 Malignant neoplasm of rectum: Secondary | ICD-10-CM | POA: Diagnosis not present

## 2015-12-31 DIAGNOSIS — Z452 Encounter for adjustment and management of vascular access device: Secondary | ICD-10-CM | POA: Diagnosis not present

## 2016-01-07 DIAGNOSIS — E039 Hypothyroidism, unspecified: Secondary | ICD-10-CM | POA: Diagnosis not present

## 2016-01-07 DIAGNOSIS — E876 Hypokalemia: Secondary | ICD-10-CM | POA: Diagnosis not present

## 2016-01-07 DIAGNOSIS — C20 Malignant neoplasm of rectum: Secondary | ICD-10-CM | POA: Diagnosis not present

## 2016-01-07 DIAGNOSIS — K529 Noninfective gastroenteritis and colitis, unspecified: Secondary | ICD-10-CM | POA: Diagnosis not present

## 2016-01-07 DIAGNOSIS — Z1389 Encounter for screening for other disorder: Secondary | ICD-10-CM | POA: Diagnosis not present

## 2016-01-07 DIAGNOSIS — E785 Hyperlipidemia, unspecified: Secondary | ICD-10-CM | POA: Diagnosis not present

## 2016-01-07 DIAGNOSIS — Z1231 Encounter for screening mammogram for malignant neoplasm of breast: Secondary | ICD-10-CM | POA: Diagnosis not present

## 2016-01-07 DIAGNOSIS — M8589 Other specified disorders of bone density and structure, multiple sites: Secondary | ICD-10-CM | POA: Diagnosis not present

## 2016-01-14 DIAGNOSIS — M160 Bilateral primary osteoarthritis of hip: Secondary | ICD-10-CM | POA: Diagnosis not present

## 2016-01-14 DIAGNOSIS — Z85038 Personal history of other malignant neoplasm of large intestine: Secondary | ICD-10-CM | POA: Diagnosis not present

## 2016-01-14 DIAGNOSIS — E876 Hypokalemia: Secondary | ICD-10-CM | POA: Diagnosis not present

## 2016-01-14 DIAGNOSIS — H547 Unspecified visual loss: Secondary | ICD-10-CM | POA: Diagnosis not present

## 2016-01-14 DIAGNOSIS — E785 Hyperlipidemia, unspecified: Secondary | ICD-10-CM | POA: Diagnosis not present

## 2016-01-14 DIAGNOSIS — M810 Age-related osteoporosis without current pathological fracture: Secondary | ICD-10-CM | POA: Diagnosis not present

## 2016-01-14 DIAGNOSIS — Z6821 Body mass index (BMI) 21.0-21.9, adult: Secondary | ICD-10-CM | POA: Diagnosis not present

## 2016-01-14 DIAGNOSIS — E039 Hypothyroidism, unspecified: Secondary | ICD-10-CM | POA: Diagnosis not present

## 2016-01-14 DIAGNOSIS — R197 Diarrhea, unspecified: Secondary | ICD-10-CM | POA: Diagnosis not present

## 2016-01-19 DIAGNOSIS — Z1231 Encounter for screening mammogram for malignant neoplasm of breast: Secondary | ICD-10-CM | POA: Diagnosis not present

## 2016-01-26 DIAGNOSIS — M8589 Other specified disorders of bone density and structure, multiple sites: Secondary | ICD-10-CM | POA: Diagnosis not present

## 2016-02-29 DIAGNOSIS — R5383 Other fatigue: Secondary | ICD-10-CM | POA: Diagnosis not present

## 2016-02-29 DIAGNOSIS — F5104 Psychophysiologic insomnia: Secondary | ICD-10-CM | POA: Diagnosis not present

## 2016-02-29 DIAGNOSIS — R29898 Other symptoms and signs involving the musculoskeletal system: Secondary | ICD-10-CM | POA: Diagnosis not present

## 2016-02-29 DIAGNOSIS — K219 Gastro-esophageal reflux disease without esophagitis: Secondary | ICD-10-CM | POA: Diagnosis not present

## 2016-02-29 DIAGNOSIS — Z6821 Body mass index (BMI) 21.0-21.9, adult: Secondary | ICD-10-CM | POA: Diagnosis not present

## 2016-02-29 DIAGNOSIS — R110 Nausea: Secondary | ICD-10-CM | POA: Diagnosis not present

## 2016-03-29 DIAGNOSIS — M21371 Foot drop, right foot: Secondary | ICD-10-CM | POA: Diagnosis not present

## 2016-03-29 DIAGNOSIS — G629 Polyneuropathy, unspecified: Secondary | ICD-10-CM | POA: Diagnosis not present

## 2016-04-10 DIAGNOSIS — K529 Noninfective gastroenteritis and colitis, unspecified: Secondary | ICD-10-CM | POA: Diagnosis not present

## 2016-04-10 DIAGNOSIS — Z6821 Body mass index (BMI) 21.0-21.9, adult: Secondary | ICD-10-CM | POA: Diagnosis not present

## 2016-04-10 DIAGNOSIS — R110 Nausea: Secondary | ICD-10-CM | POA: Diagnosis not present

## 2016-04-11 DIAGNOSIS — K529 Noninfective gastroenteritis and colitis, unspecified: Secondary | ICD-10-CM | POA: Diagnosis not present

## 2016-04-27 DIAGNOSIS — M5416 Radiculopathy, lumbar region: Secondary | ICD-10-CM | POA: Diagnosis not present

## 2016-05-03 DIAGNOSIS — G5731 Lesion of lateral popliteal nerve, right lower limb: Secondary | ICD-10-CM | POA: Diagnosis not present

## 2016-05-03 DIAGNOSIS — M21371 Foot drop, right foot: Secondary | ICD-10-CM | POA: Diagnosis not present

## 2016-05-25 DIAGNOSIS — Z85048 Personal history of other malignant neoplasm of rectum, rectosigmoid junction, and anus: Secondary | ICD-10-CM | POA: Diagnosis not present

## 2016-05-25 DIAGNOSIS — K529 Noninfective gastroenteritis and colitis, unspecified: Secondary | ICD-10-CM | POA: Diagnosis not present

## 2016-05-31 DIAGNOSIS — R109 Unspecified abdominal pain: Secondary | ICD-10-CM | POA: Diagnosis not present

## 2016-05-31 DIAGNOSIS — Z85048 Personal history of other malignant neoplasm of rectum, rectosigmoid junction, and anus: Secondary | ICD-10-CM | POA: Diagnosis not present

## 2016-05-31 DIAGNOSIS — K529 Noninfective gastroenteritis and colitis, unspecified: Secondary | ICD-10-CM | POA: Diagnosis not present

## 2016-05-31 DIAGNOSIS — C20 Malignant neoplasm of rectum: Secondary | ICD-10-CM | POA: Diagnosis not present

## 2016-06-30 DIAGNOSIS — D649 Anemia, unspecified: Secondary | ICD-10-CM | POA: Diagnosis not present

## 2016-06-30 DIAGNOSIS — Z85048 Personal history of other malignant neoplasm of rectum, rectosigmoid junction, and anus: Secondary | ICD-10-CM | POA: Diagnosis not present

## 2016-06-30 DIAGNOSIS — D509 Iron deficiency anemia, unspecified: Secondary | ICD-10-CM | POA: Diagnosis not present

## 2016-07-13 DIAGNOSIS — E039 Hypothyroidism, unspecified: Secondary | ICD-10-CM | POA: Diagnosis not present

## 2016-07-13 DIAGNOSIS — Z9181 History of falling: Secondary | ICD-10-CM | POA: Diagnosis not present

## 2016-07-13 DIAGNOSIS — Z6821 Body mass index (BMI) 21.0-21.9, adult: Secondary | ICD-10-CM | POA: Diagnosis not present

## 2016-07-13 DIAGNOSIS — K529 Noninfective gastroenteritis and colitis, unspecified: Secondary | ICD-10-CM | POA: Diagnosis not present

## 2016-07-13 DIAGNOSIS — E785 Hyperlipidemia, unspecified: Secondary | ICD-10-CM | POA: Diagnosis not present

## 2016-07-13 DIAGNOSIS — Z139 Encounter for screening, unspecified: Secondary | ICD-10-CM | POA: Diagnosis not present

## 2016-07-13 DIAGNOSIS — E876 Hypokalemia: Secondary | ICD-10-CM | POA: Diagnosis not present

## 2016-07-13 DIAGNOSIS — C20 Malignant neoplasm of rectum: Secondary | ICD-10-CM | POA: Diagnosis not present

## 2016-07-13 DIAGNOSIS — Z23 Encounter for immunization: Secondary | ICD-10-CM | POA: Diagnosis not present

## 2016-07-14 DIAGNOSIS — E039 Hypothyroidism, unspecified: Secondary | ICD-10-CM | POA: Diagnosis not present

## 2016-07-14 DIAGNOSIS — E785 Hyperlipidemia, unspecified: Secondary | ICD-10-CM | POA: Diagnosis not present

## 2016-07-14 DIAGNOSIS — E876 Hypokalemia: Secondary | ICD-10-CM | POA: Diagnosis not present

## 2016-07-27 DIAGNOSIS — R7301 Impaired fasting glucose: Secondary | ICD-10-CM | POA: Diagnosis not present

## 2016-07-28 DIAGNOSIS — Z85038 Personal history of other malignant neoplasm of large intestine: Secondary | ICD-10-CM | POA: Diagnosis not present

## 2016-07-28 DIAGNOSIS — K591 Functional diarrhea: Secondary | ICD-10-CM | POA: Diagnosis not present

## 2016-10-10 DIAGNOSIS — K146 Glossodynia: Secondary | ICD-10-CM | POA: Diagnosis not present

## 2016-10-20 DIAGNOSIS — J208 Acute bronchitis due to other specified organisms: Secondary | ICD-10-CM | POA: Diagnosis not present

## 2016-10-20 DIAGNOSIS — R6889 Other general symptoms and signs: Secondary | ICD-10-CM | POA: Diagnosis not present

## 2016-11-06 DIAGNOSIS — R3915 Urgency of urination: Secondary | ICD-10-CM | POA: Diagnosis not present

## 2016-11-06 DIAGNOSIS — Z6820 Body mass index (BMI) 20.0-20.9, adult: Secondary | ICD-10-CM | POA: Diagnosis not present

## 2016-11-06 DIAGNOSIS — R3989 Other symptoms and signs involving the genitourinary system: Secondary | ICD-10-CM | POA: Diagnosis not present

## 2016-11-07 DIAGNOSIS — R3989 Other symptoms and signs involving the genitourinary system: Secondary | ICD-10-CM | POA: Diagnosis not present

## 2016-12-29 DIAGNOSIS — Z85048 Personal history of other malignant neoplasm of rectum, rectosigmoid junction, and anus: Secondary | ICD-10-CM | POA: Diagnosis not present

## 2016-12-29 DIAGNOSIS — Z452 Encounter for adjustment and management of vascular access device: Secondary | ICD-10-CM | POA: Diagnosis not present

## 2017-01-15 DIAGNOSIS — E039 Hypothyroidism, unspecified: Secondary | ICD-10-CM | POA: Diagnosis not present

## 2017-01-15 DIAGNOSIS — Z139 Encounter for screening, unspecified: Secondary | ICD-10-CM | POA: Diagnosis not present

## 2017-01-15 DIAGNOSIS — E785 Hyperlipidemia, unspecified: Secondary | ICD-10-CM | POA: Diagnosis not present

## 2017-01-15 DIAGNOSIS — E876 Hypokalemia: Secondary | ICD-10-CM | POA: Diagnosis not present

## 2017-01-15 DIAGNOSIS — Z1389 Encounter for screening for other disorder: Secondary | ICD-10-CM | POA: Diagnosis not present

## 2017-01-15 DIAGNOSIS — C20 Malignant neoplasm of rectum: Secondary | ICD-10-CM | POA: Diagnosis not present

## 2017-01-15 DIAGNOSIS — Z1231 Encounter for screening mammogram for malignant neoplasm of breast: Secondary | ICD-10-CM | POA: Diagnosis not present

## 2017-01-15 DIAGNOSIS — K529 Noninfective gastroenteritis and colitis, unspecified: Secondary | ICD-10-CM | POA: Diagnosis not present

## 2017-01-30 DIAGNOSIS — R634 Abnormal weight loss: Secondary | ICD-10-CM | POA: Diagnosis not present

## 2017-01-30 DIAGNOSIS — K529 Noninfective gastroenteritis and colitis, unspecified: Secondary | ICD-10-CM | POA: Diagnosis not present

## 2017-01-30 DIAGNOSIS — Z85048 Personal history of other malignant neoplasm of rectum, rectosigmoid junction, and anus: Secondary | ICD-10-CM | POA: Diagnosis not present

## 2017-01-31 DIAGNOSIS — Z1231 Encounter for screening mammogram for malignant neoplasm of breast: Secondary | ICD-10-CM | POA: Diagnosis not present

## 2017-02-02 DIAGNOSIS — R197 Diarrhea, unspecified: Secondary | ICD-10-CM | POA: Diagnosis not present

## 2017-02-08 DIAGNOSIS — L821 Other seborrheic keratosis: Secondary | ICD-10-CM | POA: Diagnosis not present

## 2017-02-08 DIAGNOSIS — L820 Inflamed seborrheic keratosis: Secondary | ICD-10-CM | POA: Diagnosis not present

## 2017-02-08 DIAGNOSIS — D1801 Hemangioma of skin and subcutaneous tissue: Secondary | ICD-10-CM | POA: Diagnosis not present

## 2017-02-08 DIAGNOSIS — R233 Spontaneous ecchymoses: Secondary | ICD-10-CM | POA: Diagnosis not present

## 2017-02-13 DIAGNOSIS — H9190 Unspecified hearing loss, unspecified ear: Secondary | ICD-10-CM | POA: Diagnosis not present

## 2017-02-13 DIAGNOSIS — Z85038 Personal history of other malignant neoplasm of large intestine: Secondary | ICD-10-CM | POA: Diagnosis not present

## 2017-02-13 DIAGNOSIS — E785 Hyperlipidemia, unspecified: Secondary | ICD-10-CM | POA: Diagnosis not present

## 2017-02-13 DIAGNOSIS — R197 Diarrhea, unspecified: Secondary | ICD-10-CM | POA: Diagnosis not present

## 2017-02-13 DIAGNOSIS — Z681 Body mass index (BMI) 19 or less, adult: Secondary | ICD-10-CM | POA: Diagnosis not present

## 2017-02-13 DIAGNOSIS — M810 Age-related osteoporosis without current pathological fracture: Secondary | ICD-10-CM | POA: Diagnosis not present

## 2017-02-13 DIAGNOSIS — H9193 Unspecified hearing loss, bilateral: Secondary | ICD-10-CM | POA: Diagnosis not present

## 2017-02-13 DIAGNOSIS — E039 Hypothyroidism, unspecified: Secondary | ICD-10-CM | POA: Diagnosis not present

## 2017-02-13 DIAGNOSIS — M1612 Unilateral primary osteoarthritis, left hip: Secondary | ICD-10-CM | POA: Diagnosis not present

## 2017-03-14 DIAGNOSIS — Z85048 Personal history of other malignant neoplasm of rectum, rectosigmoid junction, and anus: Secondary | ICD-10-CM | POA: Diagnosis not present

## 2017-03-14 DIAGNOSIS — K529 Noninfective gastroenteritis and colitis, unspecified: Secondary | ICD-10-CM | POA: Diagnosis not present

## 2017-06-29 DIAGNOSIS — K591 Functional diarrhea: Secondary | ICD-10-CM | POA: Diagnosis not present

## 2017-06-29 DIAGNOSIS — Z85048 Personal history of other malignant neoplasm of rectum, rectosigmoid junction, and anus: Secondary | ICD-10-CM | POA: Diagnosis not present

## 2017-07-13 DIAGNOSIS — E785 Hyperlipidemia, unspecified: Secondary | ICD-10-CM | POA: Diagnosis not present

## 2017-07-13 DIAGNOSIS — E876 Hypokalemia: Secondary | ICD-10-CM | POA: Diagnosis not present

## 2017-07-13 DIAGNOSIS — E039 Hypothyroidism, unspecified: Secondary | ICD-10-CM | POA: Diagnosis not present

## 2017-07-13 DIAGNOSIS — C20 Malignant neoplasm of rectum: Secondary | ICD-10-CM | POA: Diagnosis not present

## 2017-07-13 DIAGNOSIS — E559 Vitamin D deficiency, unspecified: Secondary | ICD-10-CM | POA: Diagnosis not present

## 2017-07-25 DIAGNOSIS — Z23 Encounter for immunization: Secondary | ICD-10-CM | POA: Diagnosis not present

## 2017-07-25 DIAGNOSIS — Z1231 Encounter for screening mammogram for malignant neoplasm of breast: Secondary | ICD-10-CM | POA: Diagnosis not present

## 2017-07-25 DIAGNOSIS — Z9181 History of falling: Secondary | ICD-10-CM | POA: Diagnosis not present

## 2017-07-25 DIAGNOSIS — Z0000 Encounter for general adult medical examination without abnormal findings: Secondary | ICD-10-CM | POA: Diagnosis not present

## 2017-07-25 DIAGNOSIS — E785 Hyperlipidemia, unspecified: Secondary | ICD-10-CM | POA: Diagnosis not present

## 2017-08-08 DIAGNOSIS — L299 Pruritus, unspecified: Secondary | ICD-10-CM | POA: Diagnosis not present

## 2017-08-08 DIAGNOSIS — M159 Polyosteoarthritis, unspecified: Secondary | ICD-10-CM | POA: Diagnosis not present

## 2017-08-08 DIAGNOSIS — L03115 Cellulitis of right lower limb: Secondary | ICD-10-CM | POA: Diagnosis not present

## 2017-08-08 DIAGNOSIS — R21 Rash and other nonspecific skin eruption: Secondary | ICD-10-CM | POA: Diagnosis not present

## 2017-08-08 DIAGNOSIS — B029 Zoster without complications: Secondary | ICD-10-CM | POA: Diagnosis not present

## 2017-08-08 DIAGNOSIS — Z6820 Body mass index (BMI) 20.0-20.9, adult: Secondary | ICD-10-CM | POA: Diagnosis not present

## 2017-08-08 DIAGNOSIS — L300 Nummular dermatitis: Secondary | ICD-10-CM | POA: Diagnosis not present

## 2017-08-14 DIAGNOSIS — L600 Ingrowing nail: Secondary | ICD-10-CM | POA: Diagnosis not present

## 2017-08-28 DIAGNOSIS — L570 Actinic keratosis: Secondary | ICD-10-CM | POA: Diagnosis not present

## 2017-09-17 DIAGNOSIS — D126 Benign neoplasm of colon, unspecified: Secondary | ICD-10-CM | POA: Diagnosis not present

## 2017-09-17 DIAGNOSIS — E039 Hypothyroidism, unspecified: Secondary | ICD-10-CM | POA: Diagnosis not present

## 2017-09-17 DIAGNOSIS — Z85048 Personal history of other malignant neoplasm of rectum, rectosigmoid junction, and anus: Secondary | ICD-10-CM | POA: Diagnosis not present

## 2017-09-17 DIAGNOSIS — K529 Noninfective gastroenteritis and colitis, unspecified: Secondary | ICD-10-CM | POA: Diagnosis not present

## 2017-09-17 DIAGNOSIS — D649 Anemia, unspecified: Secondary | ICD-10-CM | POA: Diagnosis not present

## 2017-09-17 DIAGNOSIS — G629 Polyneuropathy, unspecified: Secondary | ICD-10-CM | POA: Diagnosis not present

## 2017-09-17 DIAGNOSIS — Z08 Encounter for follow-up examination after completed treatment for malignant neoplasm: Secondary | ICD-10-CM | POA: Diagnosis not present

## 2017-09-17 DIAGNOSIS — Z9049 Acquired absence of other specified parts of digestive tract: Secondary | ICD-10-CM | POA: Diagnosis not present

## 2017-09-17 DIAGNOSIS — K635 Polyp of colon: Secondary | ICD-10-CM | POA: Diagnosis not present

## 2017-09-17 DIAGNOSIS — D120 Benign neoplasm of cecum: Secondary | ICD-10-CM | POA: Diagnosis not present

## 2017-09-17 DIAGNOSIS — E538 Deficiency of other specified B group vitamins: Secondary | ICD-10-CM | POA: Diagnosis not present

## 2017-09-17 DIAGNOSIS — Z980 Intestinal bypass and anastomosis status: Secondary | ICD-10-CM | POA: Diagnosis not present

## 2017-09-17 DIAGNOSIS — E785 Hyperlipidemia, unspecified: Secondary | ICD-10-CM | POA: Diagnosis not present

## 2017-09-17 HISTORY — PX: COLONOSCOPY: SHX174

## 2017-10-15 ENCOUNTER — Encounter: Payer: Self-pay | Admitting: Gastroenterology

## 2017-10-25 DIAGNOSIS — H35372 Puckering of macula, left eye: Secondary | ICD-10-CM | POA: Diagnosis not present

## 2017-12-11 ENCOUNTER — Telehealth: Payer: Self-pay | Admitting: Gastroenterology

## 2017-12-11 ENCOUNTER — Other Ambulatory Visit: Payer: Self-pay

## 2017-12-11 NOTE — Telephone Encounter (Signed)
Patient states she has been taking medication prevalite prescribed by Dr.Gupta for about a year. Patient states medication does not seem to help and she was told by the pharmacist that she was not suppose to take it with her other current medication. Pt would like to speak with nurse for advice.

## 2017-12-12 ENCOUNTER — Telehealth: Payer: Self-pay

## 2017-12-12 NOTE — Telephone Encounter (Signed)
I have tried multiple times to contact her by phone using the number in the chart, it is not going through.

## 2017-12-12 NOTE — Telephone Encounter (Signed)
I spoke with her and she has concerns about taking both Welchol and prevalite.  I made an appointment for her to discuss with Dr Lyndel Safe.

## 2017-12-12 NOTE — Telephone Encounter (Signed)
Patient returning call and updated number in system.

## 2017-12-13 ENCOUNTER — Encounter: Payer: Self-pay | Admitting: Gastroenterology

## 2017-12-13 DIAGNOSIS — Z1231 Encounter for screening mammogram for malignant neoplasm of breast: Secondary | ICD-10-CM | POA: Diagnosis not present

## 2017-12-13 DIAGNOSIS — Z681 Body mass index (BMI) 19 or less, adult: Secondary | ICD-10-CM | POA: Diagnosis not present

## 2017-12-13 DIAGNOSIS — R918 Other nonspecific abnormal finding of lung field: Secondary | ICD-10-CM | POA: Diagnosis not present

## 2017-12-13 DIAGNOSIS — R490 Dysphonia: Secondary | ICD-10-CM | POA: Diagnosis not present

## 2017-12-14 ENCOUNTER — Encounter: Payer: Self-pay | Admitting: Gastroenterology

## 2017-12-14 ENCOUNTER — Ambulatory Visit (INDEPENDENT_AMBULATORY_CARE_PROVIDER_SITE_OTHER): Payer: Medicare PPO | Admitting: Gastroenterology

## 2017-12-14 VITALS — BP 128/62 | HR 64 | Ht 68.0 in | Wt 127.0 lb

## 2017-12-14 DIAGNOSIS — R197 Diarrhea, unspecified: Secondary | ICD-10-CM | POA: Diagnosis not present

## 2017-12-14 MED ORDER — DIPHENOXYLATE-ATROPINE 2.5-0.025 MG PO TABS: 2.0000 | ORAL_TABLET | Freq: Two times a day (BID) | ORAL | 2 refills | Status: DC | PRN

## 2017-12-14 MED ORDER — DICYCLOMINE HCL 10 MG PO CAPS: 10.0000 mg | ORAL_CAPSULE | Freq: Two times a day (BID) | ORAL | 2 refills | Status: DC

## 2017-12-14 NOTE — Progress Notes (Signed)
IMPRESSION and PLAN:    #1.  Diarrhea (multiple etiologies -bile acid diarrhea, after colonic resection, functional, IBS, postcholecystectomy diarrhea, diarrhea due to medications, r/o pancreatic insufficiency) Last colon 09/2017 - 67mm cecal tubular adenoma, negative random colonic biopsies for microscopic colitis.  Normal TI.  - Stop cholestyramine (pt on Wellchol) - Restart Lomotil 2 po bid prn - Align 1 tab p.o. once a day. - Bentyl10mg  po bid (fall precautions). - If still with problems in 2 weeks, then we will check stool studies for fecal elastase, WBCs, cultures and give her a trial of pancreatic enzymes. - FU in 12 weeks.   #2. Weight loss Check weight every week and bring recordings to the next visit.  Patient is to report only if there is continued weight loss. If continued weight loss, will proceed with CT scan of the abdomen and pelvis with p.o. and IV contrast.  #3.  History of rectal carcinoma (stage IIIb-T3 N1 M0) status post neoadjuvant chemo-XRT followed by LAR 09/2009 at [facility] with ileostomy, then takedown of ileostomy 11/2009 followed by postop chemo.  Being followed at [facility].      HPI:    Chief Complaint:   Danielle Grimes is a 77 y.o. female here for medication recheck Still has diarrhea at the frequency of 8-10 bowel movements per day. She has stopped taking Lomotil on her own.  Has lost 5 pounds over the last 3 months. He does admit Ensure causes her to have more diarrhea. No melena or hematochezia. Last colonoscopy as above. Denies over-the-counter use of any fiber supplements,   Past Medical History:  Diagnosis Date  . Anxiety   . B12 deficiency   . Chest pain   . Clostridium enterocolitis   . Exertional dyspnea   . History of colon cancer   . HLD (hyperlipidemia)   . Hypothyroidism   . Myalgia   . Osteoporosis   . Rectal cancer [facility])     Current Outpatient Medications  Medication Sig Dispense Refill  . cholestyramine light (PREVALITE) 4 GM/DOSE  powder Take by mouth 3 (three) times daily with meals.    . colesevelam (WELCHOL) 625 MG tablet Take by mouth 2 (two) times daily with a meal.    . calcium carbonate (OS-CAL) 600 MG TABS Take 600 mg by mouth 2 (two) times daily with a meal.      . Cholecalciferol (VITAMIN D3) 1000 UNITS CAPS Take by mouth daily.      . fish oil-omega-3 fatty acids 1000 MG capsule Take 2 g by mouth daily.      Marland Kitchen levothyroxine (SYNTHROID, LEVOTHROID) 150 MCG tablet Take 150 mcg by mouth daily.      . potassium chloride SA (K-DUR,KLOR-CON) 20 MEQ tablet Take 20 mEq by mouth daily.       No current facility-administered medications for this visit.     Past Surgical History:  Procedure Laterality Date  . COLECTOMY  3/11  . COLON RESECTION  3/11  . COLONOSCOPY  10/19/2014   Colonic polyp status post polypectomy.     Family History  Problem Relation Age of Onset  . Heart attack Unknown   . Colon cancer Neg Hx     Social History   Tobacco Use  . Smoking status: Never Smoker  . Smokeless tobacco: Never Used  Substance Use Topics  . Alcohol use: No  . Drug use: No    No Known Allergies   Review of Systems: All systems reviewed and negative  except where noted in HPI.    Physical Exam:     BP 128/62   Pulse 64   Ht 5\' 8"  (1.727 m)   Wt 127 lb (57.6 kg)   BMI 19.31 kg/m  @WEIGHTLAST3 @ GENERAL:  Alert, oriented, cooperative, not in acute distress. PSYCH: :Pleasant, normal mood and affect. HEENT:  conjunctiva pink, mucous membranes moist, neck supple without masses. No jaundice. CARDIAC:  S1 S2 normal. No murmers. PULM: Normal respiratory effort, lungs CTA bilaterally, no wheezing. ABDOMEN: Inspection: No visible peristalsis, no abnormal pulsations, skin normal.  Palpation/percussion: Soft, nontender, nondistended, no rigidity, no abnormal dullness to percussion, no hepatosplenomegaly and no palpable abdominal masses.  Auscultation: Normal bowel sounds, no abdominal bruits.  Well-healed  surgical scars. Rectal exam: Deferred SKIN:  turgor, no lesions seen. Musculoskeletal:  Normal muscle tone, normal strength. NEURO: Alert and oriented x 3, no focal neurologic deficits.    Amayra Kiedrowski,MD 12/14/2017, 11:15 AM   CC Dr. Delena Bali

## 2017-12-14 NOTE — Patient Instructions (Signed)
If you are age 77 or older, your body mass index should be between 23-30. Your Body mass index is 19.31 kg/m. If this is out of the aforementioned range listed, please consider follow up with your Primary Care Provider.  If you are age 68 or younger, your body mass index should be between 19-25. Your Body mass index is 19.31 kg/m. If this is out of the aformentioned range listed, please consider follow up with your Primary Care Provider.   Stop Cholestyramine.   We have sent the following medications to your pharmacy for you to pick up at your convenience: Lomotil take 2 tablets twice daily as needed Bentyl 10 mg twice daily.   Please purchase the following medications over the counter and take as directed: Align once by mouth daily.  Please call Dr. Leland Her nurse Leeanne Rio, RN)  in 2 weeks at [phone]  to let her now how you are doing.   Thank you,  Dr. Jackquline Denmark

## 2018-01-08 DIAGNOSIS — H9193 Unspecified hearing loss, bilateral: Secondary | ICD-10-CM | POA: Diagnosis not present

## 2018-01-08 DIAGNOSIS — H6121 Impacted cerumen, right ear: Secondary | ICD-10-CM | POA: Diagnosis not present

## 2018-01-08 DIAGNOSIS — Z9221 Personal history of antineoplastic chemotherapy: Secondary | ICD-10-CM | POA: Diagnosis not present

## 2018-01-08 DIAGNOSIS — J342 Deviated nasal septum: Secondary | ICD-10-CM | POA: Diagnosis not present

## 2018-01-08 DIAGNOSIS — R490 Dysphonia: Secondary | ICD-10-CM | POA: Diagnosis not present

## 2018-01-08 DIAGNOSIS — J387 Other diseases of larynx: Secondary | ICD-10-CM | POA: Diagnosis not present

## 2018-01-08 DIAGNOSIS — Z7722 Contact with and (suspected) exposure to environmental tobacco smoke (acute) (chronic): Secondary | ICD-10-CM | POA: Diagnosis not present

## 2018-01-08 DIAGNOSIS — Z85038 Personal history of other malignant neoplasm of large intestine: Secondary | ICD-10-CM | POA: Diagnosis not present

## 2018-01-08 DIAGNOSIS — Z923 Personal history of irradiation: Secondary | ICD-10-CM | POA: Diagnosis not present

## 2018-01-12 ENCOUNTER — Encounter: Payer: Self-pay | Admitting: Gastroenterology

## 2018-01-12 DIAGNOSIS — Z1231 Encounter for screening mammogram for malignant neoplasm of breast: Secondary | ICD-10-CM | POA: Diagnosis not present

## 2018-01-12 DIAGNOSIS — E039 Hypothyroidism, unspecified: Secondary | ICD-10-CM | POA: Diagnosis not present

## 2018-01-12 DIAGNOSIS — E785 Hyperlipidemia, unspecified: Secondary | ICD-10-CM | POA: Diagnosis not present

## 2018-01-12 DIAGNOSIS — K529 Noninfective gastroenteritis and colitis, unspecified: Secondary | ICD-10-CM | POA: Diagnosis not present

## 2018-01-12 DIAGNOSIS — E876 Hypokalemia: Secondary | ICD-10-CM | POA: Diagnosis not present

## 2018-01-12 DIAGNOSIS — M8589 Other specified disorders of bone density and structure, multiple sites: Secondary | ICD-10-CM | POA: Diagnosis not present

## 2018-01-12 DIAGNOSIS — C20 Malignant neoplasm of rectum: Secondary | ICD-10-CM | POA: Diagnosis not present

## 2018-01-29 ENCOUNTER — Telehealth: Payer: Self-pay | Admitting: Gastroenterology

## 2018-01-29 NOTE — Telephone Encounter (Signed)
Sorry, I did not route this to you.

## 2018-01-29 NOTE — Telephone Encounter (Signed)
I spoke with the patient and she reports falling one time with no injury. Just a "slip". She does not relate any periods of confusion and was quite lucid with me on the phone.  She would like to stop the Bentyl and continue on the Immodium.  Please advise.  Thank you.

## 2018-01-29 NOTE — Telephone Encounter (Signed)
No problems Lets stop Bentyl Continue with Imodium Asked her to let us know if she still has problems

## 2018-01-30 ENCOUNTER — Other Ambulatory Visit: Payer: Self-pay

## 2018-01-30 NOTE — Telephone Encounter (Signed)
Patient notified by phone.  Medication discontinued from med list.

## 2018-02-13 DIAGNOSIS — Z1231 Encounter for screening mammogram for malignant neoplasm of breast: Secondary | ICD-10-CM | POA: Diagnosis not present

## 2018-02-13 DIAGNOSIS — M8589 Other specified disorders of bone density and structure, multiple sites: Secondary | ICD-10-CM | POA: Diagnosis not present

## 2018-05-25 DIAGNOSIS — Z9221 Personal history of antineoplastic chemotherapy: Secondary | ICD-10-CM | POA: Diagnosis not present

## 2018-05-25 DIAGNOSIS — K529 Noninfective gastroenteritis and colitis, unspecified: Secondary | ICD-10-CM | POA: Diagnosis not present

## 2018-05-25 DIAGNOSIS — E039 Hypothyroidism, unspecified: Secondary | ICD-10-CM | POA: Diagnosis not present

## 2018-05-25 DIAGNOSIS — M810 Age-related osteoporosis without current pathological fracture: Secondary | ICD-10-CM | POA: Diagnosis not present

## 2018-05-25 DIAGNOSIS — E785 Hyperlipidemia, unspecified: Secondary | ICD-10-CM | POA: Diagnosis not present

## 2018-05-25 DIAGNOSIS — Z85038 Personal history of other malignant neoplasm of large intestine: Secondary | ICD-10-CM | POA: Diagnosis not present

## 2018-05-25 DIAGNOSIS — Z6821 Body mass index (BMI) 21.0-21.9, adult: Secondary | ICD-10-CM | POA: Diagnosis not present

## 2018-05-25 DIAGNOSIS — Z923 Personal history of irradiation: Secondary | ICD-10-CM | POA: Diagnosis not present

## 2018-05-25 DIAGNOSIS — H9193 Unspecified hearing loss, bilateral: Secondary | ICD-10-CM | POA: Diagnosis not present

## 2018-06-10 DIAGNOSIS — K5909 Other constipation: Secondary | ICD-10-CM | POA: Diagnosis not present

## 2018-07-13 DIAGNOSIS — E785 Hyperlipidemia, unspecified: Secondary | ICD-10-CM | POA: Diagnosis not present

## 2018-07-13 DIAGNOSIS — C20 Malignant neoplasm of rectum: Secondary | ICD-10-CM | POA: Diagnosis not present

## 2018-07-13 DIAGNOSIS — E876 Hypokalemia: Secondary | ICD-10-CM | POA: Diagnosis not present

## 2018-07-13 DIAGNOSIS — K529 Noninfective gastroenteritis and colitis, unspecified: Secondary | ICD-10-CM | POA: Diagnosis not present

## 2018-07-13 DIAGNOSIS — E039 Hypothyroidism, unspecified: Secondary | ICD-10-CM | POA: Diagnosis not present

## 2018-08-01 DIAGNOSIS — R197 Diarrhea, unspecified: Secondary | ICD-10-CM | POA: Diagnosis not present

## 2018-08-01 DIAGNOSIS — R978 Other abnormal tumor markers: Secondary | ICD-10-CM | POA: Diagnosis not present

## 2018-08-01 DIAGNOSIS — Z85048 Personal history of other malignant neoplasm of rectum, rectosigmoid junction, and anus: Secondary | ICD-10-CM | POA: Diagnosis not present

## 2018-08-29 DIAGNOSIS — L578 Other skin changes due to chronic exposure to nonionizing radiation: Secondary | ICD-10-CM | POA: Diagnosis not present

## 2018-08-29 DIAGNOSIS — D1801 Hemangioma of skin and subcutaneous tissue: Secondary | ICD-10-CM | POA: Diagnosis not present

## 2018-08-29 DIAGNOSIS — D485 Neoplasm of uncertain behavior of skin: Secondary | ICD-10-CM | POA: Diagnosis not present

## 2018-08-29 DIAGNOSIS — L570 Actinic keratosis: Secondary | ICD-10-CM | POA: Diagnosis not present

## 2018-09-30 DIAGNOSIS — L299 Pruritus, unspecified: Secondary | ICD-10-CM | POA: Diagnosis not present

## 2018-09-30 DIAGNOSIS — H00012 Hordeolum externum right lower eyelid: Secondary | ICD-10-CM | POA: Diagnosis not present

## 2019-01-11 DIAGNOSIS — Z9181 History of falling: Secondary | ICD-10-CM | POA: Diagnosis not present

## 2019-01-11 DIAGNOSIS — E039 Hypothyroidism, unspecified: Secondary | ICD-10-CM | POA: Diagnosis not present

## 2019-01-11 DIAGNOSIS — C20 Malignant neoplasm of rectum: Secondary | ICD-10-CM | POA: Diagnosis not present

## 2019-01-11 DIAGNOSIS — K529 Noninfective gastroenteritis and colitis, unspecified: Secondary | ICD-10-CM | POA: Diagnosis not present

## 2019-01-11 DIAGNOSIS — Z139 Encounter for screening, unspecified: Secondary | ICD-10-CM | POA: Diagnosis not present

## 2019-01-11 DIAGNOSIS — E876 Hypokalemia: Secondary | ICD-10-CM | POA: Diagnosis not present

## 2019-01-11 DIAGNOSIS — E785 Hyperlipidemia, unspecified: Secondary | ICD-10-CM | POA: Diagnosis not present

## 2019-01-11 DIAGNOSIS — Z1231 Encounter for screening mammogram for malignant neoplasm of breast: Secondary | ICD-10-CM | POA: Diagnosis not present

## 2019-01-14 DIAGNOSIS — H00026 Hordeolum internum left eye, unspecified eyelid: Secondary | ICD-10-CM | POA: Diagnosis not present

## 2019-01-14 DIAGNOSIS — H00023 Hordeolum internum right eye, unspecified eyelid: Secondary | ICD-10-CM | POA: Diagnosis not present

## 2019-01-14 DIAGNOSIS — H01006 Unspecified blepharitis left eye, unspecified eyelid: Secondary | ICD-10-CM | POA: Diagnosis not present

## 2019-01-14 DIAGNOSIS — H01003 Unspecified blepharitis right eye, unspecified eyelid: Secondary | ICD-10-CM | POA: Diagnosis not present

## 2019-02-13 DIAGNOSIS — K529 Noninfective gastroenteritis and colitis, unspecified: Secondary | ICD-10-CM | POA: Diagnosis not present

## 2019-02-17 DIAGNOSIS — Z1231 Encounter for screening mammogram for malignant neoplasm of breast: Secondary | ICD-10-CM | POA: Diagnosis not present

## 2019-04-09 DIAGNOSIS — F411 Generalized anxiety disorder: Secondary | ICD-10-CM | POA: Diagnosis not present

## 2019-04-09 DIAGNOSIS — H8113 Benign paroxysmal vertigo, bilateral: Secondary | ICD-10-CM | POA: Diagnosis not present

## 2019-05-05 DIAGNOSIS — Z961 Presence of intraocular lens: Secondary | ICD-10-CM | POA: Diagnosis not present

## 2019-05-05 DIAGNOSIS — H52223 Regular astigmatism, bilateral: Secondary | ICD-10-CM | POA: Diagnosis not present

## 2019-05-05 DIAGNOSIS — H524 Presbyopia: Secondary | ICD-10-CM | POA: Diagnosis not present

## 2019-05-06 ENCOUNTER — Other Ambulatory Visit (INDEPENDENT_AMBULATORY_CARE_PROVIDER_SITE_OTHER): Payer: Medicare PPO

## 2019-05-06 ENCOUNTER — Other Ambulatory Visit: Payer: Self-pay

## 2019-05-06 ENCOUNTER — Encounter: Payer: Self-pay | Admitting: Gastroenterology

## 2019-05-06 ENCOUNTER — Ambulatory Visit: Payer: Medicare PPO | Admitting: Gastroenterology

## 2019-05-06 VITALS — BP 138/70 | HR 82 | Temp 98.2°F | Ht 68.0 in | Wt 139.2 lb

## 2019-05-06 DIAGNOSIS — R197 Diarrhea, unspecified: Secondary | ICD-10-CM

## 2019-05-06 LAB — CBC WITH DIFFERENTIAL/PLATELET
Basophils Absolute: 0 10*3/uL (ref 0.0–0.1)
Basophils Relative: 0.5 % (ref 0.0–3.0)
Eosinophils Absolute: 0.2 10*3/uL (ref 0.0–0.7)
Eosinophils Relative: 2.8 % (ref 0.0–5.0)
HCT: 36.9 % (ref 36.0–46.0)
Hemoglobin: 12 g/dL (ref 12.0–15.0)
Lymphocytes Relative: 24 % (ref 12.0–46.0)
Lymphs Abs: 1.7 10*3/uL (ref 0.7–4.0)
MCHC: 32.6 g/dL (ref 30.0–36.0)
MCV: 84.6 fl (ref 78.0–100.0)
Monocytes Absolute: 0.5 10*3/uL (ref 0.1–1.0)
Monocytes Relative: 7.5 % (ref 3.0–12.0)
Neutro Abs: 4.7 10*3/uL (ref 1.4–7.7)
Neutrophils Relative %: 65.2 % (ref 43.0–77.0)
Platelets: 268 10*3/uL (ref 150.0–400.0)
RBC: 4.36 Mil/uL (ref 3.87–5.11)
RDW: 13.9 % (ref 11.5–15.5)
WBC: 7.2 10*3/uL (ref 4.0–10.5)

## 2019-05-06 LAB — COMPREHENSIVE METABOLIC PANEL
ALT: 14 U/L (ref 0–35)
AST: 21 U/L (ref 0–37)
Albumin: 4.1 g/dL (ref 3.5–5.2)
Alkaline Phosphatase: 67 U/L (ref 39–117)
BUN: 11 mg/dL (ref 6–23)
CO2: 29 mEq/L (ref 19–32)
Calcium: 9.5 mg/dL (ref 8.4–10.5)
Chloride: 102 mEq/L (ref 96–112)
Creatinine, Ser: 0.67 mg/dL (ref 0.40–1.20)
GFR: 85.11 mL/min (ref >60.00)
Glucose, Bld: 102 mg/dL — ABNORMAL HIGH (ref 70–99)
Potassium: 4 mEq/L (ref 3.5–5.1)
Sodium: 138 mEq/L (ref 135–145)
Total Bilirubin: 0.5 mg/dL (ref 0.2–1.2)
Total Protein: 7.7 g/dL (ref 6.0–8.3)

## 2019-05-06 LAB — TSH: TSH: 0.71 u[IU]/mL (ref 0.35–4.50)

## 2019-05-06 NOTE — Progress Notes (Signed)
IMPRESSION and PLAN:    #1.  Diarrhea (better) (multiple etiologies -bile acid diarrhea, after colonic resection, functional, IBS, postchole diarrhea, diarrhea d/t medications, r/o pancreatic insufficiency) Last colon 09/2017 - 27mm cecal tubular adenoma, neg random colonic Bx for microscopic colitis.  Normal TI.  - Continue Wellchol bid. - Continue Lomotil 2 po bid prn (#120) - CT AP with p.o. and IV contrast to rule out recurrence. - CBC, CMP, CEA, TSH - Check stool studies for fecal elastase, WBCs, cultures and give her a trial of pancreatic enzymes, if elastace - Kegel's exercises - FU in 12 weeks. If still with problems, consider anorectal manometry with biofeedback.  #2. Weight loss (resolved) Check weight every week and bring recordings to the next visit.  Patient is to report only if there is continued weight loss. If continued weight loss, will proceed with CT scan of the abdomen and pelvis with p.o. and IV contrast.  #3.  H/O rectal carcinoma (stage IIIb-T3 N1 M0) s/p neoadjuvant chemo-XRT followed by LAR 09/2009 at [facility] with ileostomy, then takedown of ileostomy 11/2009 followed by postop chemo.  Being followed at [facility].      HPI:    Chief Complaint:   Danielle Grimes is a 78 y.o. female  For follow-up.   Diarrhea is somewhat better with Lomotil.  But still has problems.  Would occasionally get constipated now.   Does complain of abdominal bloating  Main complaint is that of inability to control stools at times  No melena or hematochezia  Weight loss is resolved.  In fact he has gained weight as detailed below.   Eating better.   Still feels like "foul smell " when she goes out in public.    Denies excessive fiber supplements, problems with milk or milk products, problems with foods with gluten.  Denies excessive sodas, chocolates, chewing gums or mints.  No over-the-counter herbal medicines.  Wt Readings from Last 3 Encounters:  05/06/19 139 lb 4 oz (63.2 kg)  12/14/17  127 lb (57.6 kg)  05/22/11 144 lb 8 oz (65.5 kg)    Past Medical History:  Diagnosis Date  . Anxiety   . B12 deficiency   . Chest pain   . Clostridium enterocolitis   . Exertional dyspnea   . History of colon cancer   . HLD (hyperlipidemia)   . Hypothyroidism   . Myalgia   . Osteoporosis   . Rectal cancer [facility])     Current Outpatient Medications  Medication Sig Dispense Refill  . calcium carbonate (OS-CAL) 600 MG TABS Take 600 mg by mouth 2 (two) times daily with a meal.      . colesevelam (WELCHOL) 625 MG tablet Take by mouth 2 (two) times daily with a meal.    . diphenoxylate-atropine (LOMOTIL) 2.5-0.025 MG tablet Take 2 tablets by mouth 2 (two) times daily as needed for diarrhea or loose stools. 120 tablet 2  . levothyroxine (SYNTHROID, LEVOTHROID) 150 MCG tablet Take 150 mcg by mouth daily.      . potassium chloride SA (K-DUR,KLOR-CON) 20 MEQ tablet Take 20 mEq by mouth daily.       No current facility-administered medications for this visit.     Past Surgical History:  Procedure Laterality Date  . COLECTOMY  3/11  . COLON RESECTION  3/11  . COLONOSCOPY  10/19/2014   Colonic polyp status post polypectomy.     Family History  Problem Relation Age of Onset  . Heart attack Other   .  Colon cancer Neg Hx     Social History   Tobacco Use  . Smoking status: Never Smoker  . Smokeless tobacco: Never Used  Substance Use Topics  . Alcohol use: No  . Drug use: No    No Known Allergies   Review of Systems: All systems reviewed and negative except where noted in HPI.    Physical Exam:     BP 138/70   Pulse 82   Temp 98.2 F (36.8 C)   Ht 5\' 8"  (1.727 m)   Wt 139 lb 4 oz (63.2 kg)   BMI 21.17 kg/m  @WEIGHTLAST3 @ GENERAL:  Alert, oriented, cooperative, not in acute distress. PSYCH: :Pleasant, normal mood and affect. HEENT:  conjunctiva pink, mucous membranes moist, neck supple without masses. No jaundice. CARDIAC:  S1 S2 normal. No murmers. PULM:  Normal respiratory effort, lungs CTA bilaterally, no wheezing. ABDOMEN: Inspection: No visible peristalsis, no abnormal pulsations, skin normal.  Palpation/percussion: Soft, nontender, nondistended, no rigidity, no abnormal dullness to percussion, no hepatosplenomegaly and no palpable abdominal masses.  Auscultation: Normal bowel sounds, no abdominal bruits.  Well-healed surgical scars. Rectal exam: Deferred SKIN:  turgor, no lesions seen. Musculoskeletal:  Normal muscle tone, normal strength. NEURO: Alert and oriented x 3, no focal neurologic deficits. 25 minutes spent with the patient today. Greater than 50% was spent in counseling and coordination of care with the patient    [name],MD 05/06/2019, 11:02 AM   CC Dr. Delena Bali

## 2019-05-06 NOTE — Patient Instructions (Addendum)
If you are age 78 or older, your body mass index should be between 23-30. Your Body mass index is 21.17 kg/m. If this is out of the aforementioned range listed, please consider follow up with your Primary Care Provider.  If you are age 48 or younger, your body mass index should be between 19-25. Your Body mass index is 21.17 kg/m. If this is out of the aformentioned range listed, please consider follow up with your Primary Care Provider.   Please go to the lab at [facility] ([place] You will need to go to level "B", you do not need an appointment for this. Hours available are 7:30 am - 4:30 pm. You will need to have this done before your CT or your appointment will be canceled.   You have been scheduled for a CT scan of the abdomen and pelvis at [facility].    You are scheduled on 05/13/19 at 2:30pm. You should arrive 15 minutes prior to your appointment time for registration. Please follow the written instructions below on the day of your exam:  WARNING: IF YOU ARE ALLERGIC TO IODINE/X-RAY DYE, PLEASE NOTIFY RADIOLOGY IMMEDIATELY AT [phone]! YOU WILL BE GIVEN A 13 HOUR PREMEDICATION PREP.  1) Do not eat or drink anything after 10:30am (4 hours prior to your test) 2) You have been given 2 bottles of oral contrast to drink. The solution may taste better if refrigerated, but do NOT add ice or any other liquid to this solution. Shake well before drinking.    Drink 1 bottle of contrast @ 12:30pm (2 hours prior to your exam)  Drink 1 bottle of contrast @ 1:30pm (1 hour prior to your exam)  You may take any medications as prescribed with a small amount of water, if necessary. If you take any of the following medications: METFORMIN, GLUCOPHAGE, GLUCOVANCE, AVANDAMET, RIOMET, FORTAMET, Archer MET, JANUMET, GLUMETZA or METAGLIP, you MAY be asked to HOLD this medication 48 hours AFTER the exam.  The purpose of you drinking the oral contrast is to aid in the  visualization of your intestinal tract. The contrast solution may cause some diarrhea. Depending on your individual set of symptoms, you may also receive an intravenous injection of x-ray contrast/dye. Plan on being at [facility] for 30 minutes or longer, depending on the type of exam you are having performed.  This test typically takes 30-45 minutes to complete.  If you have any questions regarding your exam or if you need to reschedule, you may call the CT department at [phone] between the hours of 8:00 am and 5:00 pm, Monday-Friday.  Follow up in 12 weeks.    Kegel Exercises  Kegel exercises can help strengthen your pelvic floor muscles. The pelvic floor is a group of muscles that support your rectum, small intestine, and bladder. In females, pelvic floor muscles also help support the womb (uterus). These muscles help you control the flow of urine and stool. Kegel exercises are painless and simple, and they do not require any equipment. Your provider may suggest Kegel exercises to:  Improve bladder and bowel control.  Improve sexual response.  Improve weak pelvic floor muscles after surgery to remove the uterus (hysterectomy) or pregnancy (females).  Improve weak pelvic floor muscles after prostate gland removal or surgery (males). Kegel exercises involve squeezing your pelvic floor muscles, which are the same muscles you squeeze when you try to stop the flow of urine or keep from passing gas. The exercises can  be done while sitting, standing, or lying down, but it is best to vary your position. Exercises How to do Kegel exercises: 1. Squeeze your pelvic floor muscles tight. You should feel a tight lift in your rectal area. If you are a female, you should also feel a tightness in your vaginal area. Keep your stomach, buttocks, and legs relaxed. 2. Hold the muscles tight for up to 10 seconds. 3. Breathe normally. 4. Relax your muscles. 5. Repeat as told by your health care  provider. Repeat this exercise daily as told by your health care provider. Continue to do this exercise for at least 4-6 weeks, or for as long as told by your health care provider. You may be referred to a physical therapist who can help you learn more about how to do Kegel exercises. Depending on your condition, your health care provider may recommend:  Varying how long you squeeze your muscles.  Doing several sets of exercises every day.  Doing exercises for several weeks.  Making Kegel exercises a part of your regular exercise routine. This information is not intended to replace advice given to you by your health care provider. Make sure you discuss any questions you have with your health care provider. Document Released: 06/19/2012 Document Revised: 02/20/2018 Document Reviewed: 02/20/2018 Elsevier Patient Education  2020 [employer].   Thank you,  Dr. Jackquline Denmark

## 2019-05-07 LAB — CEA: CEA: 1.4 ng/mL

## 2019-05-10 LAB — GASTROINTESTINAL PATHOGEN PANEL PCR
C. difficile Tox A/B, PCR: NOT DETECTED
Campylobacter, PCR: NOT DETECTED
Cryptosporidium, PCR: NOT DETECTED
E coli (ETEC) LT/ST PCR: NOT DETECTED
E coli (STEC) stx1/stx2, PCR: NOT DETECTED
E coli 0157, PCR: NOT DETECTED
Giardia lamblia, PCR: NOT DETECTED
Norovirus, PCR: NOT DETECTED
Rotavirus A, PCR: NOT DETECTED
Salmonella, PCR: NOT DETECTED
Shigella, PCR: NOT DETECTED

## 2019-05-10 LAB — STOOL CULTURE
MICRO NUMBER:: 1009036
MICRO NUMBER:: 1009037
MICRO NUMBER:: 1009038
SHIGA RESULT:: NOT DETECTED
SPECIMEN QUALITY:: ADEQUATE
SPECIMEN QUALITY:: ADEQUATE
SPECIMEN QUALITY:: ADEQUATE

## 2019-05-10 LAB — FECAL LACTOFERRIN, QUANT
Fecal Lactoferrin: POSITIVE — AB
MICRO NUMBER:: 1009258
SPECIMEN QUALITY:: ADEQUATE

## 2019-05-12 ENCOUNTER — Telehealth: Payer: Self-pay | Admitting: Gastroenterology

## 2019-05-12 NOTE — Telephone Encounter (Signed)
Please see additional documentation concerning this patient 

## 2019-05-12 NOTE — Telephone Encounter (Signed)
Pt called back regarding test results.

## 2019-05-13 ENCOUNTER — Other Ambulatory Visit: Payer: Self-pay

## 2019-05-13 ENCOUNTER — Ambulatory Visit (HOSPITAL_BASED_OUTPATIENT_CLINIC_OR_DEPARTMENT_OTHER)
Admission: RE | Admit: 2019-05-13 | Discharge: 2019-05-13 | Disposition: A | Discharge status: Home / Self Care | Payer: Medicare PPO | Source: Ambulatory Visit | Attending: Gastroenterology | Admitting: Gastroenterology

## 2019-05-13 DIAGNOSIS — M799 Soft tissue disorder, unspecified: Secondary | ICD-10-CM | POA: Insufficient documentation

## 2019-05-13 DIAGNOSIS — N329 Bladder disorder, unspecified: Secondary | ICD-10-CM | POA: Insufficient documentation

## 2019-05-13 DIAGNOSIS — M1611 Unilateral primary osteoarthritis, right hip: Secondary | ICD-10-CM | POA: Diagnosis not present

## 2019-05-13 DIAGNOSIS — K7689 Other specified diseases of liver: Secondary | ICD-10-CM | POA: Diagnosis not present

## 2019-05-13 DIAGNOSIS — N289 Disorder of kidney and ureter, unspecified: Secondary | ICD-10-CM | POA: Diagnosis not present

## 2019-05-13 DIAGNOSIS — R197 Diarrhea, unspecified: Secondary | ICD-10-CM | POA: Insufficient documentation

## 2019-05-13 DIAGNOSIS — I700 Atherosclerosis of aorta: Secondary | ICD-10-CM | POA: Diagnosis not present

## 2019-05-13 MED ORDER — IOHEXOL 300 MG/ML  SOLN
100.0000 mL | Freq: Once | INTRAMUSCULAR | Status: AC | PRN
  Administered 2019-05-13: 100 mL via INTRAVENOUS

## 2019-05-23 ENCOUNTER — Other Ambulatory Visit: Payer: Medicare PPO

## 2019-05-23 DIAGNOSIS — R197 Diarrhea, unspecified: Secondary | ICD-10-CM

## 2019-05-23 NOTE — Addendum Note (Signed)
Addended by: Mohammed Kindle on: 05/23/2019 12:04 PM   Modules accepted: Orders

## 2019-05-27 DIAGNOSIS — K611 Rectal abscess: Secondary | ICD-10-CM | POA: Diagnosis not present

## 2019-05-27 DIAGNOSIS — Z8719 Personal history of other diseases of the digestive system: Secondary | ICD-10-CM | POA: Diagnosis not present

## 2019-05-27 DIAGNOSIS — Z85048 Personal history of other malignant neoplasm of rectum, rectosigmoid junction, and anus: Secondary | ICD-10-CM | POA: Diagnosis not present

## 2019-05-30 LAB — PANCREATIC ELASTASE, FECAL: Pancreatic Elastase-1, Stool: >500 mcg/g

## 2019-06-02 ENCOUNTER — Ambulatory Visit: Payer: Medicare PPO | Admitting: Gastroenterology

## 2019-06-04 ENCOUNTER — Other Ambulatory Visit: Payer: Self-pay

## 2019-06-04 ENCOUNTER — Ambulatory Visit: Payer: Medicare PPO | Admitting: Gastroenterology

## 2019-06-04 ENCOUNTER — Encounter: Payer: Self-pay | Admitting: Gastroenterology

## 2019-06-04 VITALS — BP 160/84 | HR 108 | Temp 97.6°F | Ht 68.0 in | Wt 140.0 lb

## 2019-06-04 DIAGNOSIS — C20 Malignant neoplasm of rectum: Secondary | ICD-10-CM

## 2019-06-04 DIAGNOSIS — R935 Abnormal findings on diagnostic imaging of other abdominal regions, including retroperitoneum: Secondary | ICD-10-CM

## 2019-06-04 NOTE — Patient Instructions (Signed)
If you are age 78 or older, your body mass index should be between 23-30. Your Body mass index is 21.29 kg/m. If this is out of the aforementioned range listed, please consider follow up with your Primary Care Provider.  If you are age 110 or younger, your body mass index should be between 19-25. Your Body mass index is 21.29 kg/m. If this is out of the aformentioned range listed, please consider follow up with your Primary Care Provider.   You have been scheduled for an MRI at [facility] on 05/2719. Your appointment time is 12pm. Please arrive 30 minutes prior to your appointment time for registration purposes. Please make certain not to have anything to eat or drink 6 hours prior to your test. In addition, if you have any metal in your body, have a pacemaker or defibrillator, please be sure to let your ordering physician know. This test typically takes 45 minutes to 1 hour to complete. Should you need to reschedule, please call [phone] to do so.  Thank you,  Dr. Jackquline Denmark

## 2019-06-04 NOTE — Progress Notes (Signed)
IMPRESSION and PLAN:    #1. H/O rectal carcinoma (stage IIIb-T3 N1 M0) s/p neoadjuvant chemoXRT followed by LAR 09/2009 at [facility] with ileostomy (has had anastomotic leak, abscess with fistula), then takedown of ileostomy 11/2009 followed by postop chemo.  Being followed at [facility] Last colon 09/2017 - neg for recurrence. Patent anastomosis. 39mm cecal tubular adenoma s/p polypectomy, neg random colonic Bx for microscopic colitis.  Normal TI.   Presented with diarrhea and weight loss. CT AP 05/13/2019-showing 5 cm presacral "chronic abscess" at the site of previous anastomotic abscess without fistula. Nl CBC, CMP, CEA and TSH. Neg stool studies.  Treated with Cipro/Flagyl 05/2019.  #2. Weight loss (resolved)  Plan: - MRI pelvis to r/o fistula. - Copy of CT given to the patient and patient's daughter. - FU in 82 weeks.  If still with problems, consider flexible sigmoidoscopy followed by surgical FU at [facility]. - Conservative management for now.      HPI:    Chief Complaint:   Danielle Grimes is a 78 y.o. female  For follow-up.  Doing much better She is hard of hearing.  Accompanied by her daughter. The diarrhea is better and so is incontinence.  She denies having any melena or hematochezia.  No abdominal or rectal pain.  No fever or chills. Has been eating well.  Has gained the weight she had lost previously. CT - "Chronic abscess"   Seen by Dr. Forestine Na.  Advised conservative management, watch and evaluate for any fistula formation.  Surgical options are limited for now.  If problems, may require colostomy.   Patient adamant that she does not want colostomy.    Wt Readings from Last 3 Encounters:  06/04/19 140 lb (63.5 kg)  05/06/19 139 lb 4 oz (63.2 kg)  12/14/17 127 lb (57.6 kg)    Past Medical History:  Diagnosis Date  . Anxiety   . B12 deficiency   . Chest pain   . Clostridium enterocolitis   . Exertional dyspnea   . History of colon cancer   . HLD (hyperlipidemia)    . Hypothyroidism   . Myalgia   . Osteoporosis   . Rectal cancer [facility])     Current Outpatient Medications  Medication Sig Dispense Refill  . calcium carbonate (OS-CAL) 600 MG TABS Take 600 mg by mouth 2 (two) times daily with a meal.      . colesevelam (WELCHOL) 625 MG tablet Take by mouth 2 (two) times daily with a meal.    . diphenoxylate-atropine (LOMOTIL) 2.5-0.025 MG tablet Take 2 tablets by mouth 2 (two) times daily as needed for diarrhea or loose stools. 120 tablet 2  . levothyroxine (SYNTHROID, LEVOTHROID) 150 MCG tablet Take 150 mcg by mouth daily.      . potassium chloride SA (K-DUR,KLOR-CON) 20 MEQ tablet Take 20 mEq by mouth daily.       No current facility-administered medications for this visit.     Past Surgical History:  Procedure Laterality Date  . COLECTOMY  3/11  . COLON RESECTION  3/11  . COLONOSCOPY  10/19/2014   Colonic polyp status post polypectomy.     Family History  Problem Relation Age of Onset  . Heart attack Other   . Colon cancer Neg Hx     Social History   Tobacco Use  . Smoking status: Never Smoker  . Smokeless tobacco: Never Used  Substance Use Topics  . Alcohol use: No  . Drug use: No  No Known Allergies   Review of Systems: All systems reviewed and negative except where noted in HPI.    Physical Exam:     BP (!) 160/84   Pulse (!) 108   Temp 97.6 F (36.4 C)   Ht 5\' 8"  (1.727 m)   Wt 140 lb (63.5 kg)   BMI 21.29 kg/m  @WEIGHTLAST3 @ GENERAL:  Alert, oriented, cooperative, not in acute distress. PSYCH: :Pleasant, normal mood and affect. HEENT:  conjunctiva pink, mucous membranes moist, neck supple without masses. No jaundice. CARDIAC:  S1 S2 normal. No murmers. PULM: Normal respiratory effort, lungs CTA bilaterally, no wheezing. ABDOMEN: Inspection: No visible peristalsis, no abnormal pulsations, skin normal.  Palpation/percussion: Soft, nontender, nondistended, no rigidity, no abnormal dullness to percussion, no  hepatosplenomegaly and no palpable abdominal masses.  Auscultation: Normal bowel sounds, no abdominal bruits.  Well-healed surgical scars. Rectal exam: Deferred SKIN:  turgor, no lesions seen. Musculoskeletal:  Normal muscle tone, normal strength. NEURO: Alert and oriented x 3, no focal neurologic deficits. 25 minutes spent with the patient today. Greater than 50% was spent in counseling and coordination of care with the patient    Danielle Morrissey,MD 06/04/2019, 11:31 AM   CC Dr. Delena Bali

## 2019-06-13 ENCOUNTER — Ambulatory Visit (HOSPITAL_COMMUNITY)
Admission: RE | Admit: 2019-06-13 | Discharge: 2019-06-13 | Disposition: A | Discharge status: Home / Self Care | Payer: Medicare PPO | Source: Ambulatory Visit | Attending: Gastroenterology | Admitting: Gastroenterology

## 2019-06-13 ENCOUNTER — Other Ambulatory Visit: Payer: Self-pay

## 2019-06-13 DIAGNOSIS — C20 Malignant neoplasm of rectum: Secondary | ICD-10-CM | POA: Insufficient documentation

## 2019-06-13 DIAGNOSIS — R935 Abnormal findings on diagnostic imaging of other abdominal regions, including retroperitoneum: Secondary | ICD-10-CM | POA: Diagnosis not present

## 2019-06-13 MED ORDER — GADOBUTROL 1 MMOL/ML IV SOLN
6.0000 mL | Freq: Once | INTRAVENOUS | Status: AC | PRN
  Administered 2019-06-13: 13:00:00 6 mL via INTRAVENOUS

## 2019-06-23 ENCOUNTER — Telehealth: Payer: Self-pay | Admitting: Gastroenterology

## 2019-06-23 NOTE — Telephone Encounter (Signed)
Please review and advise.

## 2019-06-27 ENCOUNTER — Other Ambulatory Visit: Payer: Self-pay

## 2019-06-27 DIAGNOSIS — Z1159 Encounter for screening for other viral diseases: Secondary | ICD-10-CM

## 2019-06-27 DIAGNOSIS — R197 Diarrhea, unspecified: Secondary | ICD-10-CM

## 2019-06-27 DIAGNOSIS — C20 Malignant neoplasm of rectum: Secondary | ICD-10-CM

## 2019-06-27 DIAGNOSIS — R935 Abnormal findings on diagnostic imaging of other abdominal regions, including retroperitoneum: Secondary | ICD-10-CM

## 2019-06-27 NOTE — Addendum Note (Signed)
Addended by: Mohammed Kindle on: 06/27/2019 01:42 PM   Modules accepted: Orders

## 2019-07-01 ENCOUNTER — Other Ambulatory Visit: Payer: Medicare PPO

## 2019-07-02 ENCOUNTER — Encounter: Payer: Self-pay | Admitting: Gastroenterology

## 2019-07-04 ENCOUNTER — Other Ambulatory Visit: Payer: Medicare PPO | Admitting: Gastroenterology

## 2019-07-07 ENCOUNTER — Other Ambulatory Visit: Payer: Medicare PPO

## 2019-07-14 ENCOUNTER — Other Ambulatory Visit: Payer: Self-pay | Admitting: Gastroenterology

## 2019-07-14 ENCOUNTER — Ambulatory Visit (INDEPENDENT_AMBULATORY_CARE_PROVIDER_SITE_OTHER): Payer: Medicare PPO

## 2019-07-14 ENCOUNTER — Other Ambulatory Visit: Payer: Medicare PPO

## 2019-07-14 DIAGNOSIS — R935 Abnormal findings on diagnostic imaging of other abdominal regions, including retroperitoneum: Secondary | ICD-10-CM

## 2019-07-14 DIAGNOSIS — R197 Diarrhea, unspecified: Secondary | ICD-10-CM

## 2019-07-14 DIAGNOSIS — Z1159 Encounter for screening for other viral diseases: Secondary | ICD-10-CM | POA: Diagnosis not present

## 2019-07-14 DIAGNOSIS — C20 Malignant neoplasm of rectum: Secondary | ICD-10-CM

## 2019-07-14 LAB — SARS CORONAVIRUS 2 (TAT 6-24 HRS): SARS Coronavirus 2: NEGATIVE

## 2019-07-15 LAB — GASTROINTESTINAL PATHOGEN PANEL PCR
C. difficile Tox A/B, PCR: DETECTED — AB
Campylobacter, PCR: NOT DETECTED
Cryptosporidium, PCR: NOT DETECTED
E coli (ETEC) LT/ST PCR: NOT DETECTED
E coli (STEC) stx1/stx2, PCR: NOT DETECTED
E coli 0157, PCR: NOT DETECTED
Giardia lamblia, PCR: NOT DETECTED
Norovirus, PCR: NOT DETECTED
Rotavirus A, PCR: NOT DETECTED
Salmonella, PCR: NOT DETECTED
Shigella, PCR: NOT DETECTED

## 2019-07-16 ENCOUNTER — Other Ambulatory Visit: Payer: Self-pay

## 2019-07-16 ENCOUNTER — Ambulatory Visit (AMBULATORY_SURGERY_CENTER): Payer: Medicare PPO | Admitting: Gastroenterology

## 2019-07-16 ENCOUNTER — Encounter: Payer: Self-pay | Admitting: Gastroenterology

## 2019-07-16 VITALS — BP 161/75 | HR 68 | Temp 97.7°F | Resp 14 | Ht 68.0 in | Wt 140.0 lb

## 2019-07-16 DIAGNOSIS — C20 Malignant neoplasm of rectum: Secondary | ICD-10-CM

## 2019-07-16 DIAGNOSIS — K5730 Diverticulosis of large intestine without perforation or abscess without bleeding: Secondary | ICD-10-CM

## 2019-07-16 DIAGNOSIS — K52832 Lymphocytic colitis: Secondary | ICD-10-CM

## 2019-07-16 DIAGNOSIS — K642 Third degree hemorrhoids: Secondary | ICD-10-CM

## 2019-07-16 DIAGNOSIS — D125 Benign neoplasm of sigmoid colon: Secondary | ICD-10-CM | POA: Diagnosis not present

## 2019-07-16 MED ORDER — SODIUM CHLORIDE 0.9 % IV SOLN: 500.0000 mL | INTRAVENOUS | Status: DC

## 2019-07-16 NOTE — Patient Instructions (Signed)
YOU HAD AN ENDOSCOPIC PROCEDURE TODAY AT THE [facility]:   Refer to the procedure report that was given to you for any specific questions about what was found during the examination.  If the procedure report does not answer your questions, please call your gastroenterologist to clarify.  If you requested that your care partner not be given the details of your procedure findings, then the procedure report has been included in a sealed envelope for you to review at your convenience later.  YOU SHOULD EXPECT: Some feelings of bloating in the abdomen. Passage of more gas than usual.  Walking can help get rid of the air that was put into your GI tract during the procedure and reduce the bloating. If you had a lower endoscopy (such as a colonoscopy or flexible sigmoidoscopy) you may notice spotting of blood in your stool or on the toilet paper. If you underwent a bowel prep for your procedure, you may not have a normal bowel movement for a few days.  Please Note:  You might notice some irritation and congestion in your nose or some drainage.  This is from the oxygen used during your procedure.  There is no need for concern and it should clear up in a day or so.  SYMPTOMS TO REPORT IMMEDIATELY:   Following lower endoscopy (colonoscopy or flexible sigmoidoscopy):  Excessive amounts of blood in the stool  Significant tenderness or worsening of abdominal pains  Swelling of the abdomen that is new, acute  Fever of 100F or higher  For urgent or emergent issues, a gastroenterologist can be reached at any hour by calling [phone].   DIET:  We do recommend a small meal at first, but then you may proceed to your regular diet.  Drink plenty of fluids but you should avoid alcoholic beverages for 24 hours.  ACTIVITY:  You should plan to take it easy for the rest of today and you should NOT DRIVE or use heavy machinery until tomorrow (because of the sedation medicines used during the test).     FOLLOW UP: Our staff will call the number listed on your records 48-72 hours following your procedure to check on you and address any questions or concerns that you may have regarding the information given to you following your procedure. If we do not reach you, we will leave a message.  We will attempt to reach you two times.  During this call, we will ask if you have developed any symptoms of COVID 19. If you develop any symptoms (ie: fever, flu-like symptoms, shortness of breath, cough etc.) before then, please call [phone].  If you test positive for Covid 19 in the 2 weeks post procedure, please call and report this information to us.    If any biopsies were taken you will be contacted by phone or by letter within the next 1-3 weeks.  Please call us at [phone] if you have not heard about the biopsies in 3 weeks.    SIGNATURES/CONFIDENTIALITY: You and/or your care partner have signed paperwork which will be entered into your electronic medical record.  These signatures attest to the fact that that the information above on your After Visit Summary has been reviewed and is understood.  Full responsibility of the confidentiality of this discharge information lies with you and/or your care-partner. 

## 2019-07-16 NOTE — Progress Notes (Signed)
Report given to PACU, vss 

## 2019-07-16 NOTE — Progress Notes (Signed)
Called to room to assist during endoscopic procedure.  Patient ID and intended procedure confirmed with present staff. Received instructions for my participation in the procedure from the performing physician.  

## 2019-07-16 NOTE — Op Note (Signed)
[place] Patient Name: Danielle Grimes Procedure Date: 07/16/2019 10:02 AM MRN: [id number] Endoscopist: Jackquline Denmark , MD Age: 78 Referring MD:  Date of Birth: [birth year] Gender: Female Account #: [id number] Procedure:                Flexible Sigmoidoscopy (original intent).                            Colonscopy performed Indications:              Abnormal MRI pelvis-advised endoscopic procedures                            to rule out recurrence.                           Pt with H/O rectal carcinoma (stage IIIb-T3 N1 M0)                            s/p neoadjuvant chemoXRT followed by LAR 09/2009 at                            [facility] with ileostomy (has had anastomotic leak,                            abscess with fistula), then takedown of ileostomy                            11/2009 followed by postop chemo. Being followed at                            [facility]. Presented with diarrhea and weight loss. CT AP                            05/13/2019-showing 5 cm presacral "chronic abscess"                            at the site of previous anastomotic abscess without                            fistula. Nl CBC, CMP, CEA and TSH. Neg stool                            studies. MRI -confirmed chronic fistula/but with                            thickened rectal wall. Advised to rule out                            recurrence. Medicines:                Monitored Anesthesia Care Procedure:                Pre-Anesthesia Assessment:                           -  Prior to the procedure, a History and Physical                            was performed, and patient medications and                            allergies were reviewed. The patient's tolerance of                            previous anesthesia was also reviewed. The risks                            and benefits of the procedure and the sedation                            options and risks were discussed with the patient.        All questions were answered, and informed consent                            was obtained. Prior Anticoagulants: The patient has                            taken no previous anticoagulant or antiplatelet                            agents. ASA Grade Assessment: III - A patient with                            severe systemic disease. After reviewing the risks                            and benefits, the patient was deemed in                            satisfactory condition to undergo the procedure.                           After obtaining informed consent, the scope was                            passed under direct vision. The Colonoscope was                            introduced through the anus and advanced to the                            terminal ileum. 2-3 cm of terminal ileum was                            intubated. The original intent was sigmoidoscopy.                            We were able to perform colonoscopy.  The quality of                            the bowel preparation was good except in the cecum                            where there was retained stool which could not be                            fully washed. Scope In: Scope Out: Findings:                 Minor degree of anal stenosis was noted. Rectum was                            small d/t radiation without any mucosal changes                            except for mucosal atrophy. One 6 mm wide-open                            sinus opening was identified in right lateral                            rectum, 5 cm from the dentate line.                            Photodocumentation was obtained. There was no                            recurrence of rectal cancer. There were no masses.                            Retroflexed examination was not performed                            intentionally.                           A 8 mm polyp was found in the mid sigmoid colon.                            The polyp was  sessile. The polyp was removed with a                            hot snare. Resection and retrieval were complete.                            Estimated blood loss: none.                           A few small-mouthed diverticula were found in the  sigmoid colon. Biopsies for histology were taken                            with a cold forceps for evaluation of microscopic                            colitis. Estimated blood loss: none.                           Remaining colonic mucosa was normal. We did obtain                            multiple random colonic biopsies to rule out                            microscopic colitis.                           Terminal ileal mucosa was normal without any                            erosions or ulcers. Complications:            No immediate complications. Estimated Blood Loss:     Estimated blood loss: none. Impression:               -Radiation changes in the rectum (small contracted                            rectum without any mucosal telangiectasia)                           -Anal stenosis.                           -Sinus opening in the rectum without recurrence of                            cancer.                           -Sigmoid colonic polyp s/p polypectomy.                           -Mild sigmoid diverticulosis.                           -Otherwise normal colonoscopy to TI. Recommendation:           - Patient has a contact number available for                            emergencies. The signs and symptoms of potential                            delayed complications were discussed with the  patient. Return to normal activities tomorrow.                            Written discharge instructions were provided to the                            patient.                           - Resume previous diet.                           - Await pathology results.                           - Follow  biopsies.                           - Discussed detail with the patient's daughter Danielle Grimes. Jackquline Denmark, MD 07/16/2019 10:49:51 AM This report has been signed electronically.

## 2019-07-16 NOTE — Progress Notes (Signed)
Temp JB  vs cw

## 2019-07-21 ENCOUNTER — Other Ambulatory Visit: Payer: Self-pay

## 2019-07-21 ENCOUNTER — Telehealth: Payer: Self-pay

## 2019-07-21 DIAGNOSIS — A0472 Enterocolitis due to Clostridium difficile, not specified as recurrent: Secondary | ICD-10-CM

## 2019-07-21 MED ORDER — VANCOMYCIN HCL 125 MG PO CAPS: 125.0000 mg | ORAL_CAPSULE | Freq: Four times a day (QID) | ORAL | 0 refills | Status: AC

## 2019-07-21 NOTE — Telephone Encounter (Signed)
  Follow up Call-  Call back number 07/16/2019  Post procedure Call Back phone  # [phone]  Permission to leave phone message Yes  Some recent data might be hidden     Patient questions:  Do you have a fever, pain , or abdominal swelling? No. Pain Score  0 *  Have you tolerated food without any problems? Yes.    Have you been able to return to your normal activities? Yes.    Do you have any questions about your discharge instructions: Diet   No. Medications  No. Follow up visit  No.  Do you have questions or concerns about your Care? No.  Actions: * If pain score is 4 or above: No action needed, pain <4.  1. Have you developed a fever since your procedure? no  2.   Have you had an respiratory symptoms (SOB or cough) since your procedure? no 3.   Have you tested positive for COVID 19 since your procedure no  4.   Have you had any family members/close contacts diagnosed with the COVID 19 since your procedure?  no   If yes to any of these questions please route to Joylene John, RN and Alphonsa Gin, Therapist, sports.

## 2019-08-14 DIAGNOSIS — Z9181 History of falling: Secondary | ICD-10-CM | POA: Diagnosis not present

## 2019-08-14 DIAGNOSIS — E785 Hyperlipidemia, unspecified: Secondary | ICD-10-CM | POA: Diagnosis not present

## 2019-08-14 DIAGNOSIS — Z0000 Encounter for general adult medical examination without abnormal findings: Secondary | ICD-10-CM | POA: Diagnosis not present

## 2019-09-09 DIAGNOSIS — L821 Other seborrheic keratosis: Secondary | ICD-10-CM | POA: Diagnosis not present

## 2019-09-09 DIAGNOSIS — L578 Other skin changes due to chronic exposure to nonionizing radiation: Secondary | ICD-10-CM | POA: Diagnosis not present

## 2019-09-09 DIAGNOSIS — L300 Nummular dermatitis: Secondary | ICD-10-CM | POA: Diagnosis not present

## 2019-09-09 DIAGNOSIS — L570 Actinic keratosis: Secondary | ICD-10-CM | POA: Diagnosis not present

## 2019-09-09 DIAGNOSIS — C44311 Basal cell carcinoma of skin of nose: Secondary | ICD-10-CM | POA: Diagnosis not present

## 2019-10-09 DIAGNOSIS — E538 Deficiency of other specified B group vitamins: Secondary | ICD-10-CM | POA: Diagnosis not present

## 2019-10-09 DIAGNOSIS — E785 Hyperlipidemia, unspecified: Secondary | ICD-10-CM | POA: Diagnosis not present

## 2019-10-09 DIAGNOSIS — C20 Malignant neoplasm of rectum: Secondary | ICD-10-CM | POA: Diagnosis not present

## 2019-10-09 DIAGNOSIS — E039 Hypothyroidism, unspecified: Secondary | ICD-10-CM | POA: Diagnosis not present

## 2019-10-09 DIAGNOSIS — K529 Noninfective gastroenteritis and colitis, unspecified: Secondary | ICD-10-CM | POA: Diagnosis not present

## 2019-10-09 DIAGNOSIS — E876 Hypokalemia: Secondary | ICD-10-CM | POA: Diagnosis not present

## 2019-10-13 DIAGNOSIS — Z961 Presence of intraocular lens: Secondary | ICD-10-CM | POA: Diagnosis not present

## 2019-10-13 DIAGNOSIS — H04123 Dry eye syndrome of bilateral lacrimal glands: Secondary | ICD-10-CM | POA: Diagnosis not present

## 2019-10-13 DIAGNOSIS — R519 Headache, unspecified: Secondary | ICD-10-CM | POA: Diagnosis not present

## 2019-10-15 DIAGNOSIS — R410 Disorientation, unspecified: Secondary | ICD-10-CM | POA: Diagnosis not present

## 2019-10-15 DIAGNOSIS — R413 Other amnesia: Secondary | ICD-10-CM | POA: Diagnosis not present

## 2019-11-10 DIAGNOSIS — E876 Hypokalemia: Secondary | ICD-10-CM | POA: Diagnosis not present

## 2019-11-10 DIAGNOSIS — E785 Hyperlipidemia, unspecified: Secondary | ICD-10-CM | POA: Diagnosis not present

## 2019-11-10 DIAGNOSIS — K529 Noninfective gastroenteritis and colitis, unspecified: Secondary | ICD-10-CM | POA: Diagnosis not present

## 2019-11-10 DIAGNOSIS — E039 Hypothyroidism, unspecified: Secondary | ICD-10-CM | POA: Diagnosis not present

## 2019-11-10 DIAGNOSIS — R413 Other amnesia: Secondary | ICD-10-CM | POA: Diagnosis not present

## 2019-11-11 DIAGNOSIS — E039 Hypothyroidism, unspecified: Secondary | ICD-10-CM | POA: Diagnosis not present

## 2019-11-11 DIAGNOSIS — E876 Hypokalemia: Secondary | ICD-10-CM | POA: Diagnosis not present

## 2019-11-11 DIAGNOSIS — E785 Hyperlipidemia, unspecified: Secondary | ICD-10-CM | POA: Diagnosis not present

## 2019-12-12 DIAGNOSIS — E785 Hyperlipidemia, unspecified: Secondary | ICD-10-CM | POA: Diagnosis not present

## 2019-12-12 DIAGNOSIS — E039 Hypothyroidism, unspecified: Secondary | ICD-10-CM | POA: Diagnosis not present

## 2019-12-12 DIAGNOSIS — E876 Hypokalemia: Secondary | ICD-10-CM | POA: Diagnosis not present

## 2019-12-12 DIAGNOSIS — R413 Other amnesia: Secondary | ICD-10-CM | POA: Diagnosis not present

## 2020-01-12 DIAGNOSIS — L570 Actinic keratosis: Secondary | ICD-10-CM | POA: Diagnosis not present

## 2020-01-12 DIAGNOSIS — L821 Other seborrheic keratosis: Secondary | ICD-10-CM | POA: Diagnosis not present

## 2020-01-12 DIAGNOSIS — C44319 Basal cell carcinoma of skin of other parts of face: Secondary | ICD-10-CM | POA: Diagnosis not present

## 2020-01-12 DIAGNOSIS — L820 Inflamed seborrheic keratosis: Secondary | ICD-10-CM | POA: Diagnosis not present

## 2020-01-12 DIAGNOSIS — L578 Other skin changes due to chronic exposure to nonionizing radiation: Secondary | ICD-10-CM | POA: Diagnosis not present

## 2020-02-27 DIAGNOSIS — K580 Irritable bowel syndrome with diarrhea: Secondary | ICD-10-CM | POA: Diagnosis not present

## 2020-03-12 DIAGNOSIS — R21 Rash and other nonspecific skin eruption: Secondary | ICD-10-CM | POA: Diagnosis not present

## 2020-03-12 DIAGNOSIS — R030 Elevated blood-pressure reading, without diagnosis of hypertension: Secondary | ICD-10-CM | POA: Diagnosis not present

## 2020-03-16 DIAGNOSIS — E876 Hypokalemia: Secondary | ICD-10-CM | POA: Diagnosis not present

## 2020-03-16 DIAGNOSIS — E785 Hyperlipidemia, unspecified: Secondary | ICD-10-CM | POA: Diagnosis not present

## 2020-03-16 DIAGNOSIS — Z139 Encounter for screening, unspecified: Secondary | ICD-10-CM | POA: Diagnosis not present

## 2020-03-16 DIAGNOSIS — R413 Other amnesia: Secondary | ICD-10-CM | POA: Diagnosis not present

## 2020-03-16 DIAGNOSIS — E039 Hypothyroidism, unspecified: Secondary | ICD-10-CM | POA: Diagnosis not present

## 2020-03-16 DIAGNOSIS — M25562 Pain in left knee: Secondary | ICD-10-CM | POA: Diagnosis not present

## 2020-05-18 DIAGNOSIS — Z23 Encounter for immunization: Secondary | ICD-10-CM | POA: Diagnosis not present

## 2020-05-18 DIAGNOSIS — R413 Other amnesia: Secondary | ICD-10-CM | POA: Diagnosis not present

## 2020-05-18 DIAGNOSIS — E039 Hypothyroidism, unspecified: Secondary | ICD-10-CM | POA: Diagnosis not present

## 2020-06-08 DIAGNOSIS — R197 Diarrhea, unspecified: Secondary | ICD-10-CM | POA: Diagnosis not present

## 2020-08-17 DIAGNOSIS — E785 Hyperlipidemia, unspecified: Secondary | ICD-10-CM | POA: Diagnosis not present

## 2020-08-17 DIAGNOSIS — Z0000 Encounter for general adult medical examination without abnormal findings: Secondary | ICD-10-CM | POA: Diagnosis not present

## 2020-08-17 DIAGNOSIS — Z9181 History of falling: Secondary | ICD-10-CM | POA: Diagnosis not present

## 2020-08-18 DIAGNOSIS — E039 Hypothyroidism, unspecified: Secondary | ICD-10-CM | POA: Diagnosis not present

## 2020-08-18 DIAGNOSIS — K529 Noninfective gastroenteritis and colitis, unspecified: Secondary | ICD-10-CM | POA: Diagnosis not present

## 2020-08-18 DIAGNOSIS — E876 Hypokalemia: Secondary | ICD-10-CM | POA: Diagnosis not present

## 2020-08-18 DIAGNOSIS — Z9181 History of falling: Secondary | ICD-10-CM | POA: Diagnosis not present

## 2020-08-18 DIAGNOSIS — R413 Other amnesia: Secondary | ICD-10-CM | POA: Diagnosis not present

## 2020-08-18 DIAGNOSIS — Z1231 Encounter for screening mammogram for malignant neoplasm of breast: Secondary | ICD-10-CM | POA: Diagnosis not present

## 2020-08-18 DIAGNOSIS — E785 Hyperlipidemia, unspecified: Secondary | ICD-10-CM | POA: Diagnosis not present

## 2020-10-21 DIAGNOSIS — K52832 Lymphocytic colitis: Secondary | ICD-10-CM | POA: Diagnosis not present

## 2020-11-16 DIAGNOSIS — K529 Noninfective gastroenteritis and colitis, unspecified: Secondary | ICD-10-CM | POA: Diagnosis not present

## 2020-11-16 DIAGNOSIS — E876 Hypokalemia: Secondary | ICD-10-CM | POA: Diagnosis not present

## 2020-11-16 DIAGNOSIS — E785 Hyperlipidemia, unspecified: Secondary | ICD-10-CM | POA: Diagnosis not present

## 2020-11-16 DIAGNOSIS — E039 Hypothyroidism, unspecified: Secondary | ICD-10-CM | POA: Diagnosis not present

## 2020-11-16 DIAGNOSIS — R413 Other amnesia: Secondary | ICD-10-CM | POA: Diagnosis not present

## 2020-11-30 ENCOUNTER — Ambulatory Visit: Payer: Medicare PPO | Admitting: Physician Assistant

## 2020-12-30 DIAGNOSIS — K52832 Lymphocytic colitis: Secondary | ICD-10-CM | POA: Diagnosis not present

## 2021-02-03 DIAGNOSIS — R410 Disorientation, unspecified: Secondary | ICD-10-CM | POA: Diagnosis not present

## 2021-02-03 DIAGNOSIS — Z6820 Body mass index (BMI) 20.0-20.9, adult: Secondary | ICD-10-CM | POA: Diagnosis not present

## 2021-02-03 DIAGNOSIS — N390 Urinary tract infection, site not specified: Secondary | ICD-10-CM | POA: Diagnosis not present

## 2021-02-04 DIAGNOSIS — N390 Urinary tract infection, site not specified: Secondary | ICD-10-CM | POA: Diagnosis not present

## 2021-02-17 DIAGNOSIS — E876 Hypokalemia: Secondary | ICD-10-CM | POA: Diagnosis not present

## 2021-02-17 DIAGNOSIS — K529 Noninfective gastroenteritis and colitis, unspecified: Secondary | ICD-10-CM | POA: Diagnosis not present

## 2021-02-17 DIAGNOSIS — E039 Hypothyroidism, unspecified: Secondary | ICD-10-CM | POA: Diagnosis not present

## 2021-02-17 DIAGNOSIS — E785 Hyperlipidemia, unspecified: Secondary | ICD-10-CM | POA: Diagnosis not present

## 2021-04-05 DIAGNOSIS — K52832 Lymphocytic colitis: Secondary | ICD-10-CM | POA: Diagnosis not present

## 2021-05-20 DIAGNOSIS — E039 Hypothyroidism, unspecified: Secondary | ICD-10-CM | POA: Diagnosis not present

## 2021-05-20 DIAGNOSIS — Z23 Encounter for immunization: Secondary | ICD-10-CM | POA: Diagnosis not present

## 2021-05-20 DIAGNOSIS — E876 Hypokalemia: Secondary | ICD-10-CM | POA: Diagnosis not present

## 2021-05-20 DIAGNOSIS — K529 Noninfective gastroenteritis and colitis, unspecified: Secondary | ICD-10-CM | POA: Diagnosis not present

## 2021-05-20 DIAGNOSIS — E785 Hyperlipidemia, unspecified: Secondary | ICD-10-CM | POA: Diagnosis not present

## 2021-06-03 ENCOUNTER — Encounter: Payer: Self-pay | Admitting: Physician Assistant

## 2021-06-03 ENCOUNTER — Ambulatory Visit: Payer: Medicare Other | Admitting: Physician Assistant

## 2021-06-03 ENCOUNTER — Other Ambulatory Visit: Payer: Self-pay

## 2021-06-03 VITALS — BP 201/80 | HR 97 | Resp 18 | Ht 69.0 in | Wt 140.0 lb

## 2021-06-03 DIAGNOSIS — F03918 Unspecified dementia, unspecified severity, with other behavioral disturbance: Secondary | ICD-10-CM

## 2021-06-03 DIAGNOSIS — R413 Other amnesia: Secondary | ICD-10-CM | POA: Diagnosis not present

## 2021-06-03 MED ORDER — MEMANTINE HCL 10 MG PO TABS: ORAL_TABLET | ORAL | 11 refills | Status: DC

## 2021-06-03 NOTE — Progress Notes (Addendum)
Assessment/Plan:    Dementia with behavioral disturbance  this is a 80 y.o. year old right-handed female with a history of hypertension, hyperlipidemia, hypothyroidism, h/o colon cancer, anxiety, depression seen today for evaluation of memory loss. MoCA today 11/30 with deficiencies in visuospatial, attention, language, and delayed recall 0/5. Symptoms suggestive of dementia, likely due to Alzheimer's disease.   Recommendations   MRI brain with/without contrast to assess for underlying structural abnormality and assess vascular load  Increase   Memantine to 10 mg 2 x a day  1 tablet (10 mg at night) for 2 weeks, keeping 5 mg in the morning, then increase to 1 tablet (10 mg) twice a day   Discussed safety both in and out of the home.  Discussed the importance of regular daily schedule with inclusion of crossword puzzles to maintain brain function.  Continue to monitor mood with PCP.  Stay active at least 30 minutes at least 3 times a week.  Naps should be scheduled and should be no longer than 60 minutes and should not occur after 2 PM.  Mediterranean diet is recommended  Folllow up once results above are available   Subjective:    The patient is seen in neurologic consultation at the request of Nicoletta Dress, MD for the evaluation of memory.  The patient is accompanied by  who supplements the history. She reports her memory being "on and off, gradually worse over the last year especially during the last 3 months ".  Her daughter who has noticed this changes, states that recently they went on a family trip and asked a family member if they ever met her husband, basically confusing family members "all the time".  She continues to repeat stories and asked the same questions.  She does not know what she came to the room for.  She has been leaving the water running, or fixing fluid and not eating need, or confusing the trash can throwing clothing thinking is a hamper.  She lives alone,  and her daughter monitors her every day, at least 3 times a day.  She has placed a monitor outside of the house, with a camera because she keeps saying that people are coming to her house.  Also, she has reported that "some people 2 weeks ago have been messing up with my checkbook, becoming very angry ".  Her daughter and has looked into these videos, not seeing anyone.  She spends most of the day sleeping, and at night she stays awake.  Unfortunately, she has been feeling "down about these, calling herself dumb ".  These memory loss has been causing depression and mild paranoia.  Hallucinations also include seeing her dead parents during the night.  She does not drive.  Also, she has developed a nervous tremor, and picking at her skin over the last 6 months.  Although keeping herself clean showering 2 times a day, and not needing assistance to bathe or to change, she has "let go of the house, hoarding frequently ".  She uses a pillbox for her medications, and denies missing any doses.  Her daughter is in charge of the finances.  Her appetite is decreased, if her children are not around to bring her food, she will not eat.  She also has not been drinking enough water, and she is prone to dehydration.  Denies trouble swallowing.  She does not cook.  She ambulates, rarely with a cane, she does not like it feeling that she is going  to fall with it.  However, her daughter states that she is "unsteady in her feet ".  No falls are reported.  She denies any headache, double vision, dizziness, focal numbness or tingling, or unilateral weakness.  As mentioned above, she reports very mild intention tremor, no anosmia, or seizures.  She has urine incontinence, and uses depends and poise together, denies constipation.  She has occasional diarrhea.  Denies anosmia, sleep apnea, alcohol or tobacco.  Family history negative for dementia.    Allergies  Allergen Reactions   Aloe Vera Other (See Comments)    Ask patient Other  Reaction: Blistering rash Ask patient     Current Outpatient Medications  Medication Instructions   calcium carbonate (OS-CAL) 600 mg, 2 times daily with meals   colesevelam (WELCHOL) 625 MG tablet Oral, 2 times daily with meals   diphenoxylate-atropine (LOMOTIL) 2.5-0.025 MG tablet 2 tablets, Oral, 2 times daily PRN   levothyroxine (SYNTHROID) 150 mcg, Daily   memantine (NAMENDA) 10 MG tablet Take 10 mg nightly for 2  weeks and then increase to 1 tab (10 mg) 2x a day   potassium chloride SA (K-DUR,KLOR-CON) 20 MEQ tablet 20 mEq, Daily     VITALS:   Vitals:   06/03/21 1341  BP: (!) 201/80  Pulse: 97  Resp: 18  SpO2: 100%  Weight: 140 lb (63.5 kg)  Height: _0  (1.753 m)   No flowsheet data found.  PHYSICAL EXAM   HEENT:  Normocephalic, atraumatic. The mucous membranes are moist. The superficial temporal arteries are without ropiness or tenderness. Cardiovascular: Regular rate and rhythm. Lungs: Clear to auscultation bilaterally. Neck: There are no carotid bruits noted bilaterally.  NEUROLOGICAL: Montreal Cognitive Assessment  06/05/2021  Visuospatial/ Executive (0/5) 2  Naming (0/3) 3  Attention: Read list of digits (0/2) 2  Attention: Read list of letters (0/1) 0  Attention: Serial 7 subtraction starting at 100 (0/3) 1  Language: Repeat phrase (0/2) 0  Language : Fluency (0/1) 1  Abstraction (0/2) 1  Delayed Recall (0/5) 0  Orientation (0/6) 1  Total 11  Adjusted Score (based on education) 11   No flowsheet data found.  No flowsheet data found.   Orientation:  Alert and oriented to person, place and time. No aphasia or dysarthria. Fund of knowledge is appropriate. Recent and remote memory are impaired.  Attention and concentration are reduced.  Able to name objects, and repeat phrases.  Delayed recall 0/5.   Cranial nerves: There is good facial symmetry. Extraocular muscles are intact and visual fields are full to confrontational testing. Speech is fluent and  clear. Soft palate rises symmetrically and there is no tongue deviation. Hearing is intact to conversational tone. Tone: Tone is good throughout.  No cogwheeling noted.  No tremors were noted at the time of evaluation. Sensation: Sensation is intact to light touch and pinprick throughout. Vibration is intact at the bilateral big toe.There is no extinction with double simultaneous stimulation. There is no sensory dermatomal level identified. Coordination: The patient has no difficulty with RAM's or FNF bilaterally. Normal finger to nose  Motor: Strength is 5/5 in the bilateral upper and lower extremities. There is no pronator drift. There are no fasciculations noted. DTR's: Deep tendon reflexes are 2/4 at the bilateral biceps, triceps, brachioradialis, patella and achilles.  Plantar responses are downgoing bilaterally. Gait and Station: The patient is able to ambulate without difficulty.The patient is able to heel toe walk without any difficulty.The patient is able to ambulate in a  tandem fashion. The patient is able to stand in the Romberg position.     Thank you for allowing [place] the opportunity to participate in the care of this nice patient. Please do not hesitate to contact us for any questions or concerns.   Total time spent on today's visit was 70 minutes, including both face-to-face time and nonface-to-face time.  Time included that spent on review of records (prior notes available to me/labs/imaging if pertinent), discussing treatment and goals, answering patient's questions and coordinating care.  Cc:  Nicoletta Dress, MD  Sharene Butters 06/05/2021 3:18 PM

## 2021-06-03 NOTE — Patient Instructions (Addendum)
It was a pleasure to see you today at our office.   Recommendations:  Meds: Follow up in 6  months Continue Memantine but increase it to 10 mg at night for the first 2 weeks, then increase to 10 mg 2 x a day  MRI brain    RECOMMENDATIONS FOR ALL PATIENTS WITH MEMORY PROBLEMS: 1. Continue to exercise (Recommend 30 minutes of walking everyday, or 3 hours every week) 2. Increase social interactions - continue going to [place] and enjoy social gatherings with friends and family 3. Eat healthy, avoid fried foods and eat more fruits and vegetables 4. Maintain adequate blood pressure, blood sugar, and blood cholesterol level. Reducing the risk of stroke and cardiovascular disease also helps promoting better memory. 5. Avoid stressful situations. Live a simple life and avoid aggravations. Organize your time and prepare for the next day in anticipation. 6. Sleep well, avoid any interruptions of sleep and avoid any distractions in the bedroom that may interfere with adequate sleep quality 7. Avoid sugar, avoid sweets as there is a strong link between excessive sugar intake, diabetes, and cognitive impairment We discussed the Mediterranean diet, which has been shown to help patients reduce the risk of progressive memory disorders and reduces cardiovascular risk. This includes eating fish, eat fruits and green leafy vegetables, nuts like almonds and hazelnuts, walnuts, and also use olive oil. Avoid fast foods and fried foods as much as possible. Avoid sweets and sugar as sugar use has been linked to worsening of memory function.  There is always a concern of gradual progression of memory problems. If this is the case, then we may need to adjust level of care according to patient needs. Support, both to the patient and caregiver, should then be put into place.    The [employer] is here all day, every day for people facing Alzheimer's disease through our free 24/7 Helpline: [phone]. The  Helpline provides reliable information and support to all those who need assistance, such as individuals living with memory loss, Alzheimer's or other dementia, caregivers, health care professionals and the public.  Our highly trained and knowledgeable staff can help you with: Understanding memory loss, dementia and Alzheimer's  Medications and other treatment options  General information about aging and brain health  Skills to provide quality care and to find the best care from professionals  Legal, financial and living-arrangement decisions Our Helpline also features: Confidential care consultation provided by master's level clinicians who can help with decision-making support, crisis assistance and education on issues families face every day  Help in a caller's preferred language using our translation service that features more than 200 languages and dialects  Referrals to local community programs, services and ongoing support     FALL PRECAUTIONS: Be cautious when walking. Scan the area for obstacles that may increase the risk of trips and falls. When getting up in the mornings, sit up at the edge of the bed for a few minutes before getting out of bed. Consider elevating the bed at the head end to avoid drop of blood pressure when getting up. Walk always in a well-lit room (use night lights in the walls). Avoid area rugs or power cords from appliances in the middle of the walkways. Use a walker or a cane if necessary and consider physical therapy for balance exercise. Get your eyesight checked regularly.  FINANCIAL OVERSIGHT: Supervision, especially oversight when making financial decisions or transactions is also recommended.  HOME SAFETY: Consider the safety of the kitchen when  operating appliances like stoves, microwave oven, and blender. Consider having supervision and share cooking responsibilities until no longer able to participate in those. Accidents with firearms and other hazards in  the house should be identified and addressed as well.   ABILITY TO BE LEFT ALONE: If patient is unable to contact 911 operator, consider using LifeLine, or when the need is there, arrange for someone to stay with patients. Smoking is a fire hazard, consider supervision or cessation. Risk of wandering should be assessed by caregiver and if detected at any point, supervision and safe proof recommendations should be instituted.  MEDICATION SUPERVISION: Inability to self-administer medication needs to be constantly addressed. Implement a mechanism to ensure safe administration of the medications.   DRIVING: Regarding driving, in patients with progressive memory problems, driving will be impaired. We advise to have someone else do the driving if trouble finding directions or if minor accidents are reported. Independent driving assessment is available to determine safety of driving.   If you are interested in the driving assessment, you can contact the following:  [employer] in [place]  [facility] [place] [phone] or [phone]      Toast refers to food and lifestyle choices that are based on the traditions of countries located on the [employer]. This way of eating has been shown to help prevent certain conditions and improve outcomes for people who have chronic diseases, like kidney disease and heart disease. What are tips for following this plan? Lifestyle  Cook and eat meals together with your family, when possible. Drink enough fluid to keep your urine clear or pale yellow. Be physically active every day. This includes: Aerobic exercise like running or swimming. Leisure activities like gardening, walking, or housework. Get 7-8 hours of sleep each night. If recommended by your health care provider, drink red wine in moderation. This  means 1 glass a day for nonpregnant women and 2 glasses a day for men. A glass of wine equals 5 oz (150 mL). Reading food labels  Check the serving size of packaged foods. For foods such as rice and pasta, the serving size refers to the amount of cooked product, not dry. Check the total fat in packaged foods. Avoid foods that have saturated fat or trans fats. Check the ingredients list for added sugars, such as corn syrup. Shopping  At the grocery store, buy most of your food from the areas near the walls of the store. This includes: Fresh fruits and vegetables (produce). Grains, beans, nuts, and seeds. Some of these may be available in unpackaged forms or large amounts (in bulk). Fresh seafood. Poultry and eggs. Low-fat dairy products. Buy whole ingredients instead of prepackaged foods. Buy fresh fruits and vegetables in-season from local farmers markets. Buy frozen fruits and vegetables in resealable bags. If you do not have access to quality fresh seafood, buy precooked frozen shrimp or canned fish, such as tuna, salmon, or sardines. Buy small amounts of raw or cooked vegetables, salads, or olives from the deli or salad bar at your store. Stock your pantry so you always have certain foods on hand, such as olive oil, canned tuna, canned tomatoes, rice, pasta, and beans. Cooking  Cook foods with extra-virgin olive oil instead of using butter or other vegetable oils. Have meat as a side dish, and have vegetables or grains as your main dish. This means having meat in small portions or adding  small amounts of meat to foods like pasta or stew. Use beans or vegetables instead of meat in common dishes like chili or lasagna. Experiment with different cooking methods. Try roasting or broiling vegetables instead of steaming or sauteing them. Add frozen vegetables to soups, stews, pasta, or rice. Add nuts or seeds for added healthy fat at each meal. You can add these to yogurt, salads, or vegetable  dishes. Marinate fish or vegetables using olive oil, lemon juice, garlic, and fresh herbs. Meal planning  Plan to eat 1 vegetarian meal one day each week. Try to work up to 2 vegetarian meals, if possible. Eat seafood 2 or more times a week. Have healthy snacks readily available, such as: Vegetable sticks with hummus. Greek yogurt. Fruit and nut trail mix. Eat balanced meals throughout the week. This includes: Fruit: 2-3 servings a day Vegetables: 4-5 servings a day Low-fat dairy: 2 servings a day Fish, poultry, or lean meat: 1 serving a day Beans and legumes: 2 or more servings a week Nuts and seeds: 1-2 servings a day Whole grains: 6-8 servings a day Extra-virgin olive oil: 3-4 servings a day Limit red meat and sweets to only a few servings a month What are my food choices? Mediterranean diet Recommended Grains: Whole-grain pasta. Brown rice. Bulgar wheat. Polenta. Couscous. Whole-wheat bread. [place]. Vegetables: Artichokes. Beets. Broccoli. Cabbage. Carrots. Eggplant. Green beans. Chard. Kale. Spinach. Onions. Leeks. Peas. Squash. Tomatoes. Peppers. Radishes. Fruits: Apples. Apricots. Avocado. Berries. Bananas. Cherries. Dates. Figs. Grapes. Lemons. Melon. Oranges. Peaches. Plums. Pomegranate. Meats and other protein foods: Beans. Almonds. Sunflower seeds. Pine nuts. Peanuts. [place]. Salmon. Scallops. Shrimp. [place]. Tilapia. Clams. Oysters. Eggs. Dairy: Low-fat milk. Cheese. Greek yogurt. Beverages: Water. Red wine. Herbal tea. Fats and oils: Extra virgin olive oil. Avocado oil. Grape seed oil. Sweets and desserts: [place] yogurt with honey. Baked apples. Poached pears. Trail mix. Seasoning and other foods: Basil. Cilantro. Coriander. Cumin. Mint. Parsley. Sage. Rosemary. Tarragon. Garlic. Oregano. Thyme. Pepper. Balsalmic vinegar. Tahini. Hummus. Tomato sauce. Olives. Mushrooms. Limit these Grains: Prepackaged pasta or rice dishes. Prepackaged cereal with added  sugar. Vegetables: Deep fried potatoes (french fries). Fruits: Fruit canned in syrup. Meats and other protein foods: Beef. Pork. Lamb. Poultry with skin. Hot dogs. [place] Salines. Dairy: Ice cream. Sour cream. Whole milk. Beverages: Juice. Sugar-sweetened soft drinks. Beer. Liquor and spirits. Fats and oils: Butter. Canola oil. Vegetable oil. Beef fat (tallow). Lard. Sweets and desserts: Cookies. Cakes. Pies. Candy. Seasoning and other foods: Mayonnaise. Premade sauces and marinades. The items listed may not be a complete list. Talk with your dietitian about what dietary choices are right for you. Summary The Mediterranean diet includes both food and lifestyle choices. Eat a variety of fresh fruits and vegetables, beans, nuts, seeds, and whole grains. Limit the amount of red meat and sweets that you eat. Talk with your health care provider about whether it is safe for you to drink red wine in moderation. This means 1 glass a day for nonpregnant women and 2 glasses a day for men. A glass of wine equals 5 oz (150 mL). This information is not intended to replace advice given to you by your health care provider. Make sure you discuss any questions you have with your health care provider. Document Released: 02/24/2016 Document Revised: 03/28/2016 Document Reviewed: 02/24/2016 Elsevier Interactive Patient Education  2017 [employer].

## 2021-06-05 DIAGNOSIS — F03918 Unspecified dementia, unspecified severity, with other behavioral disturbance: Secondary | ICD-10-CM | POA: Insufficient documentation

## 2021-06-23 ENCOUNTER — Ambulatory Visit
Admission: RE | Admit: 2021-06-23 | Discharge: 2021-06-23 | Disposition: A | Discharge status: Home / Self Care | Payer: Medicare Other | Source: Ambulatory Visit | Attending: Physician Assistant | Admitting: Physician Assistant

## 2021-06-23 ENCOUNTER — Other Ambulatory Visit: Payer: Self-pay

## 2021-06-23 DIAGNOSIS — I6782 Cerebral ischemia: Secondary | ICD-10-CM | POA: Diagnosis not present

## 2021-06-23 DIAGNOSIS — H43393 Other vitreous opacities, bilateral: Secondary | ICD-10-CM | POA: Diagnosis not present

## 2021-06-23 DIAGNOSIS — R413 Other amnesia: Secondary | ICD-10-CM | POA: Diagnosis not present

## 2021-06-27 DIAGNOSIS — R1031 Right lower quadrant pain: Secondary | ICD-10-CM | POA: Diagnosis not present

## 2021-06-27 DIAGNOSIS — L22 Diaper dermatitis: Secondary | ICD-10-CM | POA: Diagnosis not present

## 2021-07-27 DIAGNOSIS — R296 Repeated falls: Secondary | ICD-10-CM | POA: Diagnosis not present

## 2021-07-27 DIAGNOSIS — R262 Difficulty in walking, not elsewhere classified: Secondary | ICD-10-CM | POA: Diagnosis not present

## 2021-08-01 DIAGNOSIS — M6281 Muscle weakness (generalized): Secondary | ICD-10-CM | POA: Diagnosis not present

## 2021-08-01 DIAGNOSIS — R2689 Other abnormalities of gait and mobility: Secondary | ICD-10-CM | POA: Diagnosis not present

## 2021-08-09 DIAGNOSIS — R2689 Other abnormalities of gait and mobility: Secondary | ICD-10-CM | POA: Diagnosis not present

## 2021-08-09 DIAGNOSIS — M6281 Muscle weakness (generalized): Secondary | ICD-10-CM | POA: Diagnosis not present

## 2021-08-16 DIAGNOSIS — M6281 Muscle weakness (generalized): Secondary | ICD-10-CM | POA: Diagnosis not present

## 2021-08-16 DIAGNOSIS — R2689 Other abnormalities of gait and mobility: Secondary | ICD-10-CM | POA: Diagnosis not present

## 2021-08-20 DIAGNOSIS — E785 Hyperlipidemia, unspecified: Secondary | ICD-10-CM | POA: Diagnosis not present

## 2021-08-20 DIAGNOSIS — Z139 Encounter for screening, unspecified: Secondary | ICD-10-CM | POA: Diagnosis not present

## 2021-08-20 DIAGNOSIS — Z6822 Body mass index (BMI) 22.0-22.9, adult: Secondary | ICD-10-CM | POA: Diagnosis not present

## 2021-08-20 DIAGNOSIS — G309 Alzheimer's disease, unspecified: Secondary | ICD-10-CM | POA: Diagnosis not present

## 2021-08-20 DIAGNOSIS — L989 Disorder of the skin and subcutaneous tissue, unspecified: Secondary | ICD-10-CM | POA: Diagnosis not present

## 2021-08-20 DIAGNOSIS — E039 Hypothyroidism, unspecified: Secondary | ICD-10-CM | POA: Diagnosis not present

## 2021-08-20 DIAGNOSIS — K529 Noninfective gastroenteritis and colitis, unspecified: Secondary | ICD-10-CM | POA: Diagnosis not present

## 2021-08-20 DIAGNOSIS — E876 Hypokalemia: Secondary | ICD-10-CM | POA: Diagnosis not present

## 2021-08-26 DIAGNOSIS — R2689 Other abnormalities of gait and mobility: Secondary | ICD-10-CM | POA: Diagnosis not present

## 2021-08-26 DIAGNOSIS — M6281 Muscle weakness (generalized): Secondary | ICD-10-CM | POA: Diagnosis not present

## 2021-09-01 DIAGNOSIS — R2689 Other abnormalities of gait and mobility: Secondary | ICD-10-CM | POA: Diagnosis not present

## 2021-09-01 DIAGNOSIS — M6281 Muscle weakness (generalized): Secondary | ICD-10-CM | POA: Diagnosis not present

## 2021-09-14 DIAGNOSIS — L300 Nummular dermatitis: Secondary | ICD-10-CM | POA: Diagnosis not present

## 2021-09-14 DIAGNOSIS — D485 Neoplasm of uncertain behavior of skin: Secondary | ICD-10-CM | POA: Diagnosis not present

## 2021-09-14 DIAGNOSIS — M6281 Muscle weakness (generalized): Secondary | ICD-10-CM | POA: Diagnosis not present

## 2021-09-14 DIAGNOSIS — L820 Inflamed seborrheic keratosis: Secondary | ICD-10-CM | POA: Diagnosis not present

## 2021-09-14 DIAGNOSIS — R2689 Other abnormalities of gait and mobility: Secondary | ICD-10-CM | POA: Diagnosis not present

## 2021-09-22 DIAGNOSIS — M6281 Muscle weakness (generalized): Secondary | ICD-10-CM | POA: Diagnosis not present

## 2021-09-22 DIAGNOSIS — R2689 Other abnormalities of gait and mobility: Secondary | ICD-10-CM | POA: Diagnosis not present

## 2021-10-04 DIAGNOSIS — K52832 Lymphocytic colitis: Secondary | ICD-10-CM | POA: Diagnosis not present

## 2021-10-12 DIAGNOSIS — R2689 Other abnormalities of gait and mobility: Secondary | ICD-10-CM | POA: Diagnosis not present

## 2021-10-12 DIAGNOSIS — M6281 Muscle weakness (generalized): Secondary | ICD-10-CM | POA: Diagnosis not present

## 2021-10-18 DIAGNOSIS — M6281 Muscle weakness (generalized): Secondary | ICD-10-CM | POA: Diagnosis not present

## 2021-10-18 DIAGNOSIS — R2689 Other abnormalities of gait and mobility: Secondary | ICD-10-CM | POA: Diagnosis not present

## 2021-10-25 DIAGNOSIS — R2689 Other abnormalities of gait and mobility: Secondary | ICD-10-CM | POA: Diagnosis not present

## 2021-10-25 DIAGNOSIS — M6281 Muscle weakness (generalized): Secondary | ICD-10-CM | POA: Diagnosis not present

## 2021-11-01 DIAGNOSIS — R2689 Other abnormalities of gait and mobility: Secondary | ICD-10-CM | POA: Diagnosis not present

## 2021-11-01 DIAGNOSIS — M6281 Muscle weakness (generalized): Secondary | ICD-10-CM | POA: Diagnosis not present

## 2021-11-30 ENCOUNTER — Ambulatory Visit: Payer: Medicare Other | Admitting: Physician Assistant

## 2023-08-18 DEATH — deceased

## 2023-10-17 IMAGING — MR MR HEAD W/O CM
10 series · 48 of 48 positions shown · non-contrast
Comparison: Prior brain MRI examinations 10/15/2019 and earlier.

CLINICAL DATA: Provided history: Memory loss.

EXAM:
MRI HEAD WITHOUT CONTRAST
TECHNIQUE: Multiplanar, multiecho pulse sequences of the brain and surrounding
structures were obtained without intravenous contrast.

[Series 2: T1 · sagittal · 5.0mm · 0.45mm/px · 2 of 21 slices shown]
[im 1/21]
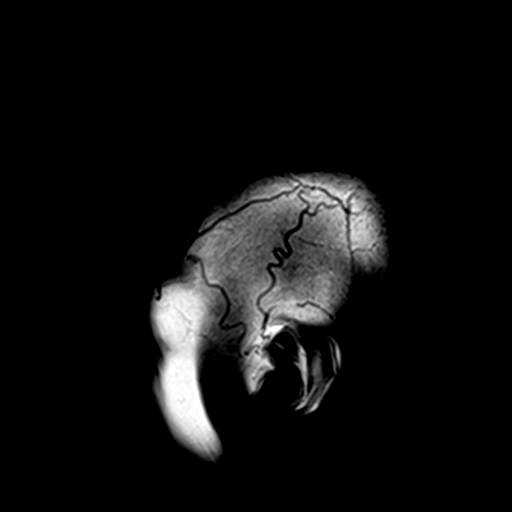
[im 21/21]
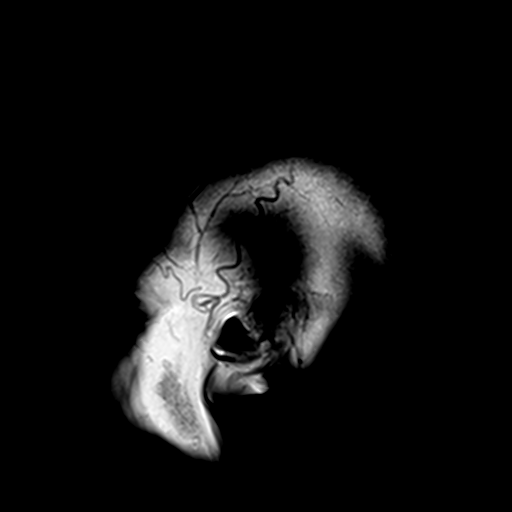

[Series 3: DWI · axial · 3.0mm · 1.80mm/px · z∈[-62,+80]mm · 9 of 100 slices shown (1 of 4)]
[im 1/100]
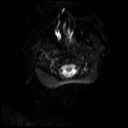
[im 13/100]
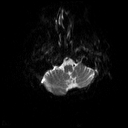
[im 25/100]
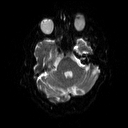
[im 38/100]
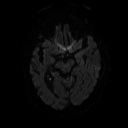
[im 50/100]
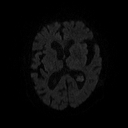
[im 62/100]
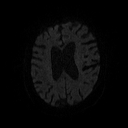
[im 75/100]
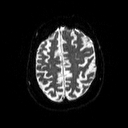
[im 87/100]
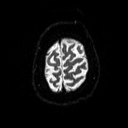
[im 100/100]
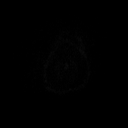

[Series 4: DWI · axial · 3.0mm · 1.80mm/px · z∈[-62,+80]mm · 4 of 50 slices shown (2 of 4)]
[im 1/50]
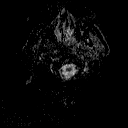
[im 17/50]
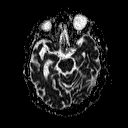
[im 33/50]
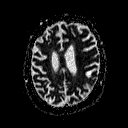
[im 50/50]
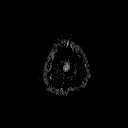

[Series 5: DWI · coronal · 5.0mm · 1.80mm/px · 6 of 67 slices shown (3 of 4)]
[im 1/67]
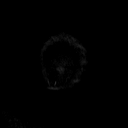
[im 14/67]
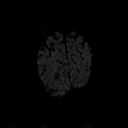
[im 27/67]
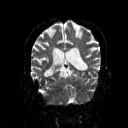
[im 40/67]
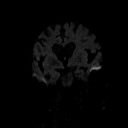
[im 53/67]
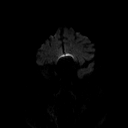
[im 67/67]
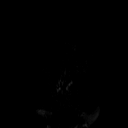

[Series 6: DWI · coronal · 5.0mm · 1.80mm/px · 3 of 34 slices shown (4 of 4)]
[im 1/34]
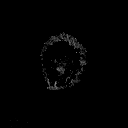
[im 17/34]
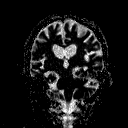
[im 34/34]
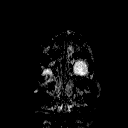

[Series 7: T2 · axial · 5.0mm · 0.51mm/px · z∈[-63,+79]mm · 2 of 22 slices shown (1 of 2)]
[im 1/22]
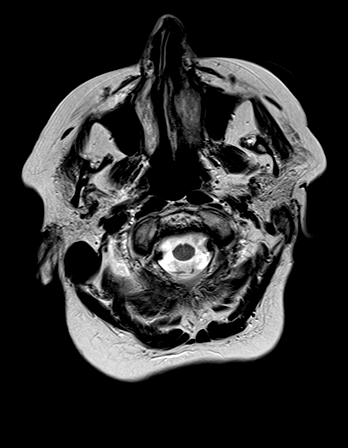
[im 22/22]
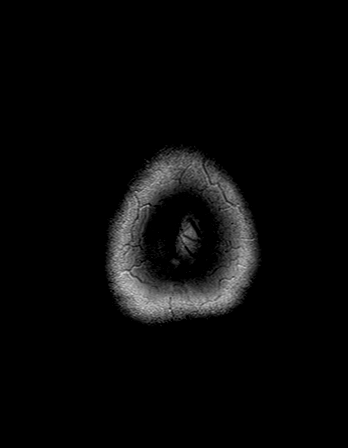

[Series 8: FLAIR · axial · 3.0mm · 0.45mm/px · z∈[-69,+79]mm · 3 of 34 slices shown]
[im 1/34]
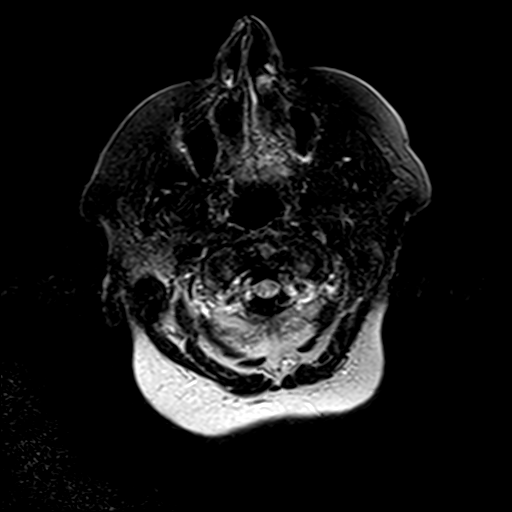
[im 17/34]
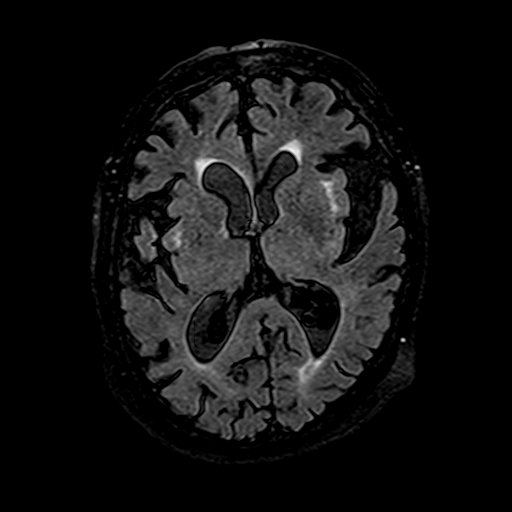
[im 34/34]
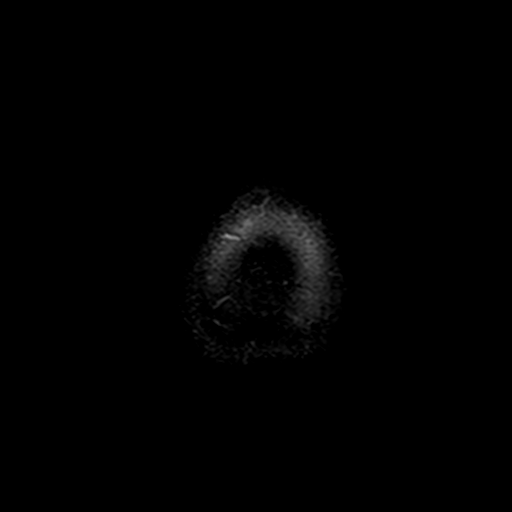

[Series 10: swi_images · axial · 4.0mm · 0.90mm/px · z∈[-70,+81]mm · 4 of 40 slices shown]
[im 1/40]
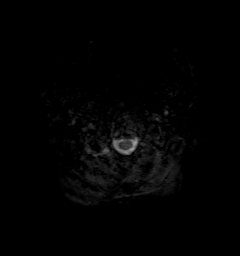
[im 14/40]
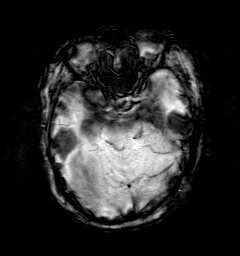
[im 27/40]
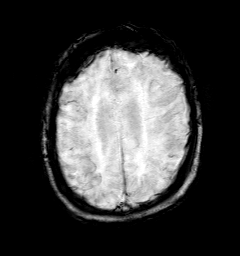
[im 40/40]
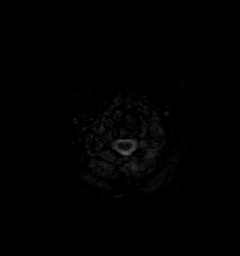

[Series 11: t1_mpr_tra · axial · 1.0mm · 0.71mm/px · z∈[-61,+77]mm · 13 of 144 slices shown]
[im 1/144]
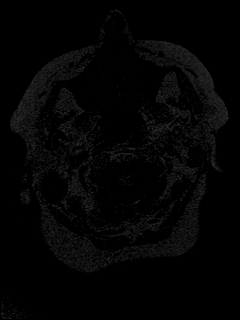
[im 12/144]
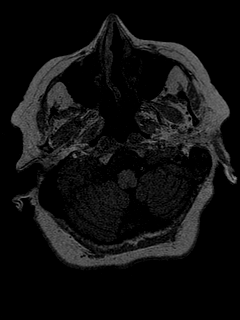
[im 24/144]
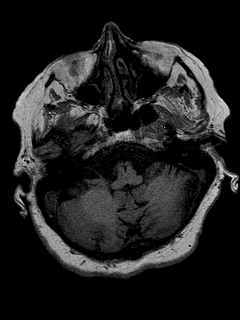
[im 36/144]
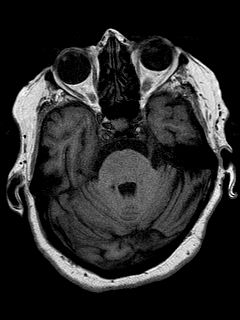
[im 48/144]
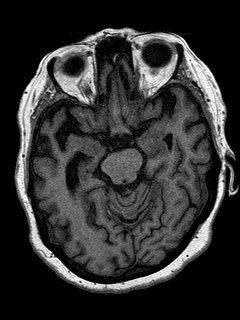
[im 60/144]
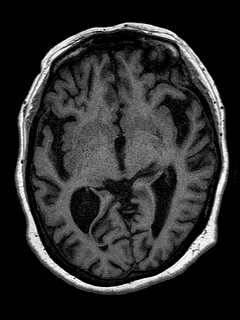
[im 72/144]
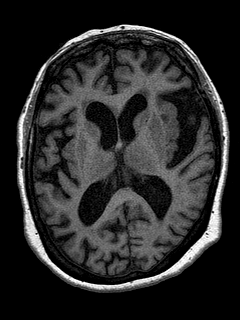
[im 84/144]
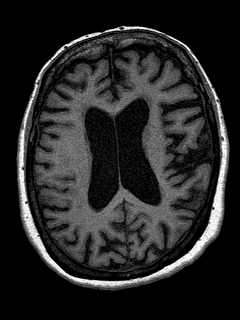
[im 96/144]
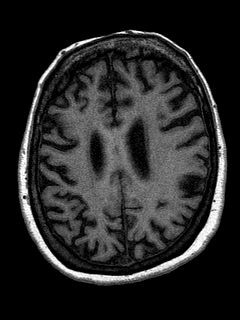
[im 108/144]
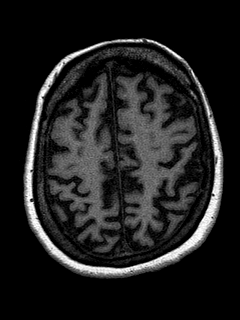
[im 120/144]
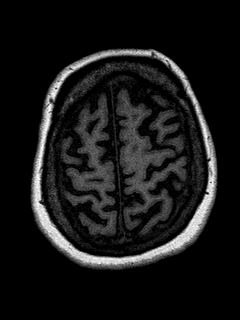
[im 132/144]
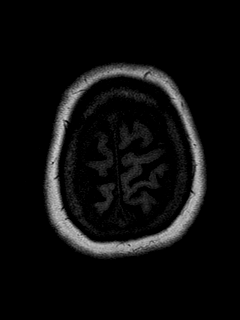
[im 144/144]
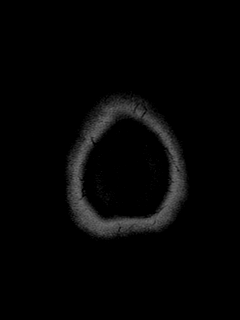

[Series 12: T2 · coronal · 5.0mm · 0.45mm/px · 2 of 25 slices shown (2 of 2)]
[im 1/25]
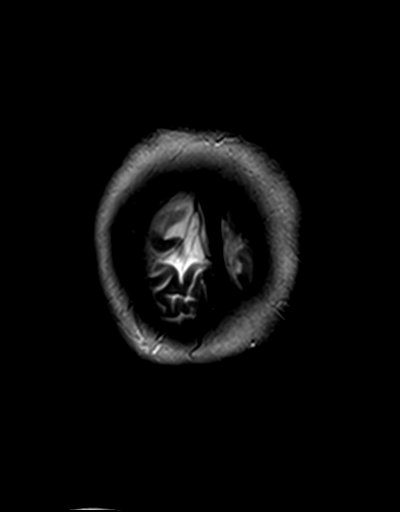
[im 25/25]
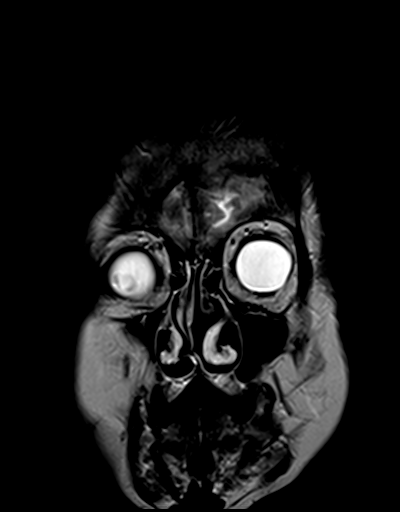

[48 of 48 positions shown; findings below may reference images not displayed]

FINDINGS: Brain:

Moderate cerebral volume loss without appreciable lobar
predominance. Comparatively mild cerebellar volume loss.

Mild multifocal T2 FLAIR hyperintense signal abnormality within the
cerebral white matter, nonspecific but compatible with chronic small
vessel ischemic disease.

There is no acute infarct.

No evidence of an intracranial mass.

No chronic intracranial blood products.

No extra-axial fluid collection.

No midline shift.

Vascular: Maintained flow voids within the proximal large arterial
vessels.

Skull and upper cervical spine: No focal suspicious marrow lesion.

Sinuses/Orbits: Visualized orbits show no acute finding. No
significant paranasal sinus disease.

Other: Small left mastoid effusion.
IMPRESSION: No evidence of acute intracranial abnormality.

Stable non-contrast MRI appearance of the brain as compared to
10/15/2019.

Mild chronic small vessel ischemic changes within the cerebral white
matter.

Moderate generalized cerebral volume loss. Comparatively mild
cerebellar volume loss.

Small left mastoid effusion.
